# Patient Record
Sex: Female | Born: 1940 | Race: White | Hispanic: No | Smoking: Current some day smoker
Health system: Southern US, Community
[De-identification: ages and names within clinical notes are randomized; demographics above are authoritative.]

## PROBLEM LIST (undated history)

## (undated) DIAGNOSIS — K219 Gastro-esophageal reflux disease without esophagitis: Secondary | ICD-10-CM

## (undated) DIAGNOSIS — J309 Allergic rhinitis, unspecified: Secondary | ICD-10-CM

## (undated) DIAGNOSIS — G4700 Insomnia, unspecified: Secondary | ICD-10-CM

## (undated) DIAGNOSIS — M549 Dorsalgia, unspecified: Secondary | ICD-10-CM

## (undated) DIAGNOSIS — M542 Cervicalgia: Secondary | ICD-10-CM

## (undated) DIAGNOSIS — J209 Acute bronchitis, unspecified: Secondary | ICD-10-CM

## (undated) HISTORY — DX: Gastro-esophageal reflux disease without esophagitis: K21.9

## (undated) HISTORY — PX: VESICOVAGINAL FISTULA CLOSURE W/ TAH: SUR271

## (undated) HISTORY — DX: Cervicalgia: M54.2

## (undated) HISTORY — DX: Insomnia, unspecified: G47.00

## (undated) HISTORY — DX: Acute bronchitis, unspecified: J20.9

## (undated) HISTORY — PX: ABDOMINAL HYSTERECTOMY: SHX81

## (undated) HISTORY — PX: TUBAL LIGATION: SHX77

---

## 2000-08-14 ENCOUNTER — Emergency Department (HOSPITAL_COMMUNITY): Admission: EM | Admit: 2000-08-14 | Discharge: 2000-08-14 | Payer: Self-pay | Admitting: Emergency Medicine

## 2000-08-14 ENCOUNTER — Encounter: Payer: Self-pay | Admitting: Emergency Medicine

## 2001-11-29 ENCOUNTER — Encounter: Payer: Self-pay | Admitting: Family Medicine

## 2001-11-29 ENCOUNTER — Encounter: Admission: RE | Admit: 2001-11-29 | Discharge: 2001-11-29 | Payer: Self-pay | Admitting: Family Medicine

## 2003-01-03 ENCOUNTER — Encounter: Payer: Self-pay | Admitting: Family Medicine

## 2003-01-03 ENCOUNTER — Encounter: Admission: RE | Admit: 2003-01-03 | Discharge: 2003-01-03 | Payer: Self-pay | Admitting: Family Medicine

## 2004-12-24 ENCOUNTER — Ambulatory Visit (HOSPITAL_COMMUNITY): Admission: RE | Admit: 2004-12-24 | Discharge: 2004-12-24 | Payer: Self-pay | Admitting: Orthopedic Surgery

## 2005-02-03 ENCOUNTER — Ambulatory Visit (HOSPITAL_BASED_OUTPATIENT_CLINIC_OR_DEPARTMENT_OTHER): Admission: RE | Admit: 2005-02-03 | Discharge: 2005-02-03 | Payer: Self-pay | Admitting: Orthopedic Surgery

## 2005-02-03 ENCOUNTER — Ambulatory Visit (HOSPITAL_COMMUNITY): Admission: RE | Admit: 2005-02-03 | Discharge: 2005-02-03 | Payer: Self-pay | Admitting: Orthopedic Surgery

## 2005-03-10 ENCOUNTER — Ambulatory Visit: Payer: Self-pay | Admitting: *Deleted

## 2005-03-18 ENCOUNTER — Ambulatory Visit: Payer: Self-pay

## 2005-04-07 ENCOUNTER — Ambulatory Visit: Payer: Self-pay | Admitting: *Deleted

## 2005-04-10 ENCOUNTER — Encounter: Admission: RE | Admit: 2005-04-10 | Discharge: 2005-04-10 | Payer: Self-pay | Admitting: Family Medicine

## 2005-05-15 ENCOUNTER — Ambulatory Visit: Payer: Self-pay | Admitting: *Deleted

## 2006-07-21 ENCOUNTER — Ambulatory Visit (HOSPITAL_COMMUNITY): Admission: RE | Admit: 2006-07-21 | Discharge: 2006-07-21 | Payer: Self-pay | Admitting: Gastroenterology

## 2006-07-21 ENCOUNTER — Encounter (INDEPENDENT_AMBULATORY_CARE_PROVIDER_SITE_OTHER): Payer: Self-pay | Admitting: Specialist

## 2007-09-15 ENCOUNTER — Encounter: Admission: RE | Admit: 2007-09-15 | Discharge: 2007-09-15 | Payer: Self-pay | Admitting: Family Medicine

## 2009-04-16 ENCOUNTER — Encounter: Admission: RE | Admit: 2009-04-16 | Discharge: 2009-04-16 | Payer: Self-pay | Admitting: Family Medicine

## 2010-01-23 ENCOUNTER — Encounter: Admission: RE | Admit: 2010-01-23 | Discharge: 2010-01-23 | Payer: Self-pay | Admitting: Family Medicine

## 2011-04-03 NOTE — Op Note (Signed)
NAME:  Debra Dillon, Debra Dillon             ACCOUNT NO.:  [id number]   MEDICAL RECORD NO.:  [id number]          PATIENT TYPE:  AMB   LOCATION:  ENDO                         FACILITY:  [facility]   PHYSICIAN:  Anselmo Rod, M.D.  DATE OF BIRTH:  October 08, 1941   DATE OF PROCEDURE:  07/21/2006  DATE OF DISCHARGE:                                 OPERATIVE REPORT   PROCEDURE:  Screening colonoscopy.   ENDOSCOPIST:  Anselmo Rod, M.D.   INSTRUMENT USED:  Olympus video colonoscope.   INDICATIONS FOR PROCEDURE:  70 year old white female undergoing screening  colonoscopy to rule out colonic polyps, masses, etc.  The patient has had  black stools off and on that were found to be guaiac positive on a routine  physical.   PREPROCEDURE PREPARATION:  Informed consent was obtained from the patient.  The patient was fasted for four hours prior to the procedure and prepped  with 32 Osmo prep pills, 20 given the night prior to the procedure and 12  the morning of the procedure.  The risks and benefits of the procedure in  great detail.   PREPROCEDURE PHYSICAL:  Patient with stable vital signs.  Neck supple.  Chest clear to auscultation.  S1 and S2 regular.  Abdomen soft with normal  bowel sounds.   DESCRIPTION OF PROCEDURE:  The patient was placed in the left lateral  decubitus position, sedated with an additional 50 mcg of Fentanyl and 4 mg  Versed in slow incremental doses.  Once the patient was adequately sedated,  maintained on low flow oxygen and continuous cardiac monitoring, the Olympus  video colonoscope was advanced from the rectum to the cecum.  The  appendiceal orifice and ileocecal valve were clearly visualized and  photographed.  No masses, polyps, erosions, ulcerations, or diverticula were  seen.  Retroflexion in the rectum revealed no abnormalities.  The patient  tolerated the procedure well with no complications.  The terminal ileum  appeared healthy without lesions.   IMPRESSION:   1.Normal colonoscopy to the terminal ileum, no masses, polyps,  or diverticula seen.  2.No source of black stool could be identified.   RECOMMENDATIONS:  1.Continue high fiber diet with liberal fluid intake.  2.Avoid all non-steroidals for now.  3.Outpatient follow up in the next two weeks for repeat guaiac testing,  further recommendations will be made at that  time.  4.Repeat colonoscopy is recommended in the next ten years unless the patient  develops any abnormal symptoms in the interim.      Anselmo Rod, M.D.  Electronically Signed     JNM/MEDQ  D:  07/21/2006  T:  07/21/2006  Job:  [id number]   cc:   Ace Gins, MD

## 2011-04-03 NOTE — Op Note (Signed)
NAME:  Debra Dillon, EGLI             ACCOUNT NO.:  [id number]   MEDICAL RECORD NO.:  [id number]          PATIENT TYPE:  AMB   LOCATION:  ENDO                         FACILITY:  [facility]   PHYSICIAN:  Anselmo Rod, M.D.  DATE OF BIRTH:  1941/07/07   DATE OF PROCEDURE:  07/21/2006  DATE OF DISCHARGE:                                 OPERATIVE REPORT   PROCEDURE:  Esophagogastroduodenoscopy with multiple gastric biopsies.   ENDOSCOPIST:  Anselmo Rod, M.D.   INSTRUMENT USED:  Olympus video panendoscope.   INDICATIONS FOR PROCEDURE:  70 year old white female with a history of black  stools found to be guaiac positive on routine physical undergoing EGD to  rule out peptic ulcer disease, esophagitis, gastritis, etc.   PREPROCEDURE PREPARATION:  Informed consent was obtained from the patient.  The patient was fasted for four hours prior to the procedure.  The risks and  benefits of the procedure were discussed with the patient in great detail.   PREPROCEDURE PHYSICAL:  Patient with stable vital signs.  Neck supple.  Chest clear to auscultation.  S1 and S2 regular.  Abdomen soft with normal  bowel sounds.   DESCRIPTION OF PROCEDURE:  The patient was placed in the left lateral  decubitus position, sedated with 50 mcg of Fentanyl and 6 mg Versed in slow  incremental doses.  Once the patient was adequately sedated, maintained on  low flow oxygen and continuous cardiac monitoring, the Olympus video  panendoscope was advanced through the mouth piece over the tongue into the  esophagus under direct vision.  The entire esophagus appeared normal and was  widely patent with no evidence of ring, strictures, masses, esophagitis, or  Barrett's mucosa.  The scope was then advanced into the stomach.  Mild  diffuse gastritis was noted and gastric biopsy was done to rule out the  presence of H. pylori by pathology.  No ulcers, erosions, masses, or polyps  were identified.  The proximal small bowel  appeared normal.  There was no  obstruction.  The patient tolerated the procedure well without immediate  complications.   IMPRESSION:  1.Normal appearing esophagus.  2.Mild diffuse gastritis, biopsies done from the stomach to rule out H.  pylori by pathology.  3.Normal proximal small bowel.   RECOMMENDATIONS:  1.Await pathology results.  2.Treat with antibiotics if H. pylori present on biopsies.  3.Proceed with colonoscopy at this time, further recommendations to follow.      Anselmo Rod, M.D.  Electronically Signed     JNM/MEDQ  D:  07/21/2006  T:  07/21/2006  Job:  [id number]   cc:   Ace Gins, MD

## 2011-04-03 NOTE — Op Note (Signed)
NAME:  Debra Dillon, FLAMM             ACCOUNT NO.:  [id number]   MEDICAL RECORD NO.:  [id number]          PATIENT TYPE:  AMB   LOCATION:  DSC                          FACILITY:  [facility]   PHYSICIAN:  Katy Fitch. 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  Identification  of minimal degenerative changes of the glenohumeral joint and about a 60%  partial-thickness degenerative tear of the long head of the biceps at the  rotator interval and extensive anterior and superior synovitis of  glenohumeral joint.   OPERATION:  1.  Diagnostic arthroscopy of left glenohumeral joint.  2.  Arthroscopic debridement of long head of biceps, deep surface of      subscapularis and supraspinatus and extensive synovectomy  followed by  3.  Subacromial decompression with coracoacromial ligament release,      bursectomy and acromioplasty.  4.  Open resection of distal 15 mm of clavicle, i.e. Mumford procedure.   OPERATING SURGEON:  Katy Fitch. Sypher, M.D.   ASSISTANT:  Marveen Reeks. Dasnoit, P.A.C.   ANESTHESIA:  General endotracheal supplemented by interscalene block.   SUPERVISING  ANESTHESIOLOGIST:  Dr. Gypsy Balsam.   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  Recently she injured her left shoulder while carrying a heavy exercise  bicycle in and out of her vehicle while conducting her occupational exams.   She was seen by Dr. Dara Hoyer, her attending family physician, and noted  to have signs of impingement and a possible rotator cuff tear. She was  referred for an upper extremity orthopedic consult and was noted have signs  of stage II or III impingement, AC degenerative change and probable biceps  tendonopathy.  She was referred for an MRI of her shoulder which confirmed a  prominent AC joint, a medial acromion that was prominent, tendonopathy the  subscapularis and supraspinatus and biceps tendonopathy.   Due to her failure to respond to nonoperative measures, she is brought to  the operating at this time anticipating arthroscopic evaluation of the left  glenohumeral joint followed by probable subacromial decompression and  debridement of her long head of biceps, labrum and capsule as necessary and  possible rotator cuff repair if a significant partial or full-thickness tear  was identified.   Preoperatively, she was seen by Dr. Gypsy Balsam who  performed an interscalene  block. After informed consent, she is brought to the operating room at this  time.   PROCEDURE:  Lamara Brecht was brought to operating room and placed in  supine position on the table.  Following the induction of general  endotracheal anesthesia under direct supervision of Dr. Gypsy Balsam, she was  carefully positioned in the beach-chair position with the aid of a torso and  head holder designed for shoulder arthroscopy.   PAS active compression stockings were placed on her legs for thromboembolism  prophylaxis and care was taken to avoid any tight restrictions around her  abdomen or pelvis.  The entire  left upper extremity and forequarter were  prepped with DuraPrep and draped with impervious arthroscopy drapes.   The procedure commenced with distension of left shoulder with 20 cc of  sterile saline with a spinal needle brought in anteriorly.  The scope was  then placed with blunt technique through a standard posterior portal.  Diagnostic arthroscopy revealed abundant fragments of the biceps tendon  obscuring vision of the joint and evidence of frayed deep surface of the  supraspinatus at the anterior margin of the rotator interval and fraying of  the superior and lateral portion of the distal subscapularis.   An anterior portal was created under direct vision and a 4.5-mm suction  shaver was used to thoroughly debride the biceps, subscapularis, and to  perform synovectomy.  Hemostasis was achieved with bipolar cautery.  A  limited capsulectomy was performed, tenolysing the subscapularis tendon to  facilitate free range of motion limited by what appeared to be mild adhesive  capsulitis.  The most distal portion of the visible biceps was inaccessible  as it was deep to the anterior edge of the supraspinatus. I debrided the  biceps as thoroughly as possible followed by photographic documentation of  this anatomic predicament with a digital camera.   The scope was then removed from the glenohumeral joint and placed in the  subacromial space. A moderate degree of bursitis was noted. After bursectomy  was accomplished, the anatomy the coracoacromial arch was studied.  The  coracoacromial ligament was prominent and appeared to be causing bursitis  inflammation on the bursal side of the rotator cuff. The coracoacromial  ligament was released with the cutting cautery and removed with the suction  shaver. Hemostasis was achieved with the radiofrequency probe.  The anatomy  of the anterior acromion was carefully studied. There is clearly a prominence of the medial acromion at the [facility] joint that  was causing contact  with the rotator cuff with forward flexion and abduction.   The acromion was leveled to a type 1 morphology with a suction shaver  followed by hemostasis with a radiofrequency probe. The capsule of the [facility]  joint was taken down with the probe and hemostasis achieved.  The procedure  was then converted to an open resection of the distal clavicle. A 2 cm  incision was fashioned directly over the [facility] joint followed by subperiosteal  exposure of the distal 15 mm clavicle and elevation of the AC capsule.  An  oscillating saw was used to remove the distal 15 mm of clavicle followed by  careful hemostasis and repair of the anterior trapezius muscle to the  anterior third of deltoid closing the dead space aided by distal clavicle  resection.  A watertight seal was achieved.   The scope was then placed in subacromial space and the decompression and  distal clavicle resection documented photographically.  There were no apparent complications.  Ms. Kulakowski tolerated surgery and  anesthesia well.  We anticipate that she can be discharged home for  aftercare under the supervision of her family followed by immediate  initiation of outpatient physical therapy under the supervision of our  office therapist.   For aftercare, she is given prescriptions for Dilaudid 2 milligrams one or  two tablets p.o. q. 4 to 6 hours p.r.n. pain, 30 tablets without refill.  Also Motrin 600 milligrams one p.o. q.6 h. p.r.n. pain with food, 30 tablets  with one refill and Keflex 500 milligrams one p.o. q.8 h. x4 days as a  prophylactic antibiotic.      RVS/MEDQ  D:  02/03/2005  T:  02/03/2005  Job:  [id number]

## 2011-06-19 ENCOUNTER — Ambulatory Visit (INDEPENDENT_AMBULATORY_CARE_PROVIDER_SITE_OTHER): Payer: Medicare Other | Admitting: Critical Care Medicine

## 2011-06-19 ENCOUNTER — Ambulatory Visit (INDEPENDENT_AMBULATORY_CARE_PROVIDER_SITE_OTHER)
Admission: RE | Admit: 2011-06-19 | Discharge: 2011-06-19 | Disposition: A | Discharge status: Home / Self Care | Payer: Medicare Other | Source: Ambulatory Visit | Attending: Critical Care Medicine | Admitting: Critical Care Medicine

## 2011-06-19 ENCOUNTER — Encounter: Payer: Self-pay | Admitting: Critical Care Medicine

## 2011-06-19 DIAGNOSIS — J42 Unspecified chronic bronchitis: Secondary | ICD-10-CM

## 2011-06-19 DIAGNOSIS — R0600 Dyspnea, unspecified: Secondary | ICD-10-CM

## 2011-06-19 DIAGNOSIS — K219 Gastro-esophageal reflux disease without esophagitis: Secondary | ICD-10-CM

## 2011-06-19 DIAGNOSIS — R0609 Other forms of dyspnea: Secondary | ICD-10-CM

## 2011-06-19 DIAGNOSIS — J209 Acute bronchitis, unspecified: Secondary | ICD-10-CM

## 2011-06-19 MED ORDER — ALBUTEROL SULFATE HFA 108 (90 BASE) MCG/ACT IN AERS: 2.0000 | INHALATION_SPRAY | Freq: Four times a day (QID) | RESPIRATORY_TRACT | Status: DC | PRN

## 2011-06-19 NOTE — Progress Notes (Signed)
Subjective:    Patient ID: Debra Dillon, female    DOB: [birth year], 70 y.o.   MRN: [id number] 69 y.o. WF Referre d for eval of dyspnea HPI Comments: Pt had physical in June and PCP heard faint wheeze.    Shortness of Breath This is a new problem. The current episode started more than 1 month ago. Episode frequency: Had onset wheezing and severe dyspnea with exertion, car to hours, up and down stairs, carrying groceries. The problem has been gradually improving. Duration: Would recover 3-5 min. Associated symptoms include sputum production and wheezing. Pertinent negatives include no abdominal pain, chest pain, ear pain, fever, headaches, hemoptysis, leg swelling, neck pain, orthopnea, PND, rash, rhinorrhea, sore throat or vomiting. The symptoms are aggravated by any activity. Risk factors include smoking. She has tried beta agonist inhalers (Zpak rx  in 5/12.  ) for the symptoms. The treatment provided moderate relief. There is no history of allergies, asthma, CAD, COPD, a heart failure, PE or pneumonia.  Cough This is a new problem. The current episode started 1 to 4 weeks ago. The problem has been gradually improving. The problem occurs hourly (was hourly at its peak, now minimal). The cough is productive of sputum (mucus is yellow-tan). Associated symptoms include shortness of breath and wheezing. Pertinent negatives include no chest pain, chills, ear pain, fever, headaches, heartburn, hemoptysis, myalgias, postnasal drip, rash, rhinorrhea or sore throat. She has tried a beta-agonist inhaler for the symptoms. Her past medical history is significant for bronchitis. There is no history of asthma, bronchiectasis, COPD, emphysema, environmental allergies or pneumonia.     Past Medical History  Diagnosis Date  . GERD (gastroesophageal reflux disease)   . Insomnia   . Acute bronchitis   . Neck pain      Family History  Problem Relation Age of Onset  . Emphysema Sister   . COPD Sister   .  Allergies Daughter   . Clotting disorder Brother   . Arthritis Sister   . Heart disease Father   . Heart disease Brother   . Heart disease Sister   . Cancer Maternal Aunt      History   Social History  . Marital Status: Divorced    Spouse Name: N/A    Number of Children: 2  . Years of Education: N/A   Occupational History  . RN    Social History Main Topics  . Smoking status: Former Smoker -- 1.0 packs/day for 38 years    Types: Cigarettes    Quit date: 06/17/2011  . Smokeless tobacco: Never Used  . Alcohol Use: No  . Drug Use: No  . Sexually Active: Not on file   Other Topics Concern  . Not on file   Social History Narrative  . No narrative on file     No Known Allergies   Outpatient Prescriptions Prior to Visit  Medication Sig Dispense Refill  . alendronate (FOSAMAX) 70 MG tablet Take 70 mg by mouth every 7 (seven) days. Take with a full glass of water on an empty stomach.       Marland Kitchen aspirin 81 MG tablet Take 81 mg by mouth daily.        . Calcium Carbonate-Vitamin D 600-400 MG-UNIT per tablet Take 2 tablets by mouth daily.       . citalopram (CELEXA) 40 MG tablet Take 40 mg by mouth daily.        . naproxen sodium (ANAPROX) 550 MG tablet Take 550 mg by  mouth 2 (two) times daily.        . pantoprazole (PROTONIX) 40 MG tablet Take 40 mg by mouth daily.        . traZODone (DESYREL) 100 MG tablet Take 100 mg by mouth at bedtime.        . diphenhydrAMINE (SOMINEX) 25 MG tablet Take 25 mg by mouth daily.        . magnesium oxide (MAG-OX) 400 MG tablet Take 400 mg by mouth daily.            Review of Systems  Constitutional: Positive for activity change, appetite change and fatigue. Negative for fever, chills, diaphoresis and unexpected weight change.  HENT: Positive for sneezing. Negative for hearing loss, ear pain, nosebleeds, congestion, sore throat, facial swelling, rhinorrhea, mouth sores, trouble swallowing, neck pain, neck stiffness, dental problem, voice  change, postnasal drip, sinus pressure, tinnitus and ear discharge.   Eyes: Negative for photophobia, discharge, itching and visual disturbance.  Respiratory: Positive for cough, sputum production, shortness of breath and wheezing. Negative for apnea, hemoptysis, choking, chest tightness and stridor.   Cardiovascular: Positive for palpitations. Negative for chest pain, orthopnea, leg swelling and PND.  Gastrointestinal: Negative for heartburn, nausea, vomiting, abdominal pain, constipation, blood in stool and abdominal distention.  Genitourinary: Negative for dysuria, urgency, frequency, hematuria, flank pain, decreased urine volume and difficulty urinating.  Musculoskeletal: Positive for back pain. Negative for myalgias, joint swelling, arthralgias and gait problem.  Skin: Negative for color change, pallor and rash.  Neurological: Negative for dizziness, tremors, seizures, syncope, speech difficulty, weakness, light-headedness, numbness and headaches.  Hematological: Negative for environmental allergies and adenopathy. Does not bruise/bleed easily.  Psychiatric/Behavioral: Positive for agitation. Negative for confusion and sleep disturbance. The patient is not nervous/anxious.        Objective:   Physical Exam Filed Vitals:   06/19/11 1051  BP: 122/80  Pulse: 78  Temp: 98.6 F (37 C)  TempSrc: Oral  Height: 4\' 11"  (1.499 m)  Weight: 108 lb 12.8 oz (49.351 kg)  SpO2: 96%    Gen: Pleasant, well-nourished, in no distress,  normal affect  ENT: No lesions,  mouth clear,  oropharynx clear, no postnasal drip  Neck: No JVD, no TMG, no carotid bruits  Lungs: No use of accessory muscles, no dullness to percussion, clear, no wheeze or rhonchi  Cardiovascular: RRR, heart sounds normal, no murmur or gallops, no peripheral edema  Abdomen: soft and NT, no HSM,  BS normal  Musculoskeletal: No deformities, no cyanosis or clubbing  Neuro: alert, non focal  Skin: Warm, no lesions or  rashes        Assessment & Plan:   Acute bronchitis Improved Normal spirometry No need for inhaled meds Plan Cont prn SABA Focus on smoking cessation    Updated Medication List Outpatient Encounter Prescriptions as of 06/19/2011  Medication Sig Dispense Refill  . albuterol (PROAIR HFA) 108 (90 BASE) MCG/ACT inhaler Inhale 2 puffs into the lungs every 6 (six) hours as needed.      Marland Kitchen alendronate (FOSAMAX) 70 MG tablet Take 70 mg by mouth every 7 (seven) days. Take with a full glass of water on an empty stomach.       Marland Kitchen aspirin 81 MG tablet Take 81 mg by mouth daily.        . Calcium Carbonate-Vitamin D 600-400 MG-UNIT per tablet Take 2 tablets by mouth daily.       . citalopram (CELEXA) 40 MG tablet Take 40 mg by mouth  daily.        . naproxen sodium (ANAPROX) 550 MG tablet Take 550 mg by mouth 2 (two) times daily.        Marland Kitchen OVER THE COUNTER MEDICATION Mega Red - 1 capsule once daily       . pantoprazole (PROTONIX) 40 MG tablet Take 40 mg by mouth daily.        . traZODone (DESYREL) 100 MG tablet Take 100 mg by mouth at bedtime.        . Zinc-Magnesium Aspart-Vit B6 (ZINC MAGNESIUM ASPARTATE PO) Take 3 tablets by mouth at bedtime.        Marland Kitchen DISCONTD: PROAIR HFA 108 (90 BASE) MCG/ACT inhaler as needed.      Marland Kitchen DISCONTD: diphenhydrAMINE (SOMINEX) 25 MG tablet Take 25 mg by mouth daily.        Marland Kitchen DISCONTD: magnesium oxide (MAG-OX) 400 MG tablet Take 400 mg by mouth daily.

## 2011-06-19 NOTE — Patient Instructions (Signed)
Use proair as needed only  No other medications needed Stay off cigarettes, if you relapse , consider the Nicoderm CQ system , start with 21mg  system Chest xray today, we will call with results Return as needed or if symptoms return

## 2011-06-20 NOTE — Assessment & Plan Note (Signed)
Improved Normal spirometry No need for inhaled meds Plan Cont prn SABA Focus on smoking cessation

## 2011-06-24 NOTE — Progress Notes (Signed)
Quick Note:  Called, spoke with pt. She is aware CXR was neg for cancer or any acute process per PW. She verbalized understanding of this and voiced no further questions/concerns at this time. ______

## 2012-11-23 ENCOUNTER — Other Ambulatory Visit: Payer: Self-pay | Admitting: Family Medicine

## 2012-11-23 DIAGNOSIS — M810 Age-related osteoporosis without current pathological fracture: Secondary | ICD-10-CM

## 2012-11-23 DIAGNOSIS — Z1231 Encounter for screening mammogram for malignant neoplasm of breast: Secondary | ICD-10-CM

## 2012-12-14 ENCOUNTER — Other Ambulatory Visit: Payer: Medicare Other

## 2012-12-14 ENCOUNTER — Ambulatory Visit: Payer: Medicare Other

## 2013-01-12 ENCOUNTER — Ambulatory Visit
Admission: RE | Admit: 2013-01-12 | Discharge: 2013-01-12 | Disposition: A | Discharge status: Home / Self Care | Payer: Medicare Other | Source: Ambulatory Visit | Attending: Family Medicine | Admitting: Family Medicine

## 2013-01-12 DIAGNOSIS — Z1231 Encounter for screening mammogram for malignant neoplasm of breast: Secondary | ICD-10-CM

## 2013-03-29 ENCOUNTER — Emergency Department (HOSPITAL_COMMUNITY)
Admission: EM | Admit: 2013-03-29 | Discharge: 2013-03-29 | Disposition: A | Discharge status: Home / Self Care | Payer: Medicare Other | Attending: Emergency Medicine | Admitting: Emergency Medicine

## 2013-03-29 ENCOUNTER — Emergency Department (HOSPITAL_COMMUNITY): Payer: Medicare Other

## 2013-03-29 ENCOUNTER — Encounter (HOSPITAL_COMMUNITY): Payer: Self-pay | Admitting: *Deleted

## 2013-03-29 DIAGNOSIS — Z87891 Personal history of nicotine dependence: Secondary | ICD-10-CM | POA: Insufficient documentation

## 2013-03-29 DIAGNOSIS — Y9389 Activity, other specified: Secondary | ICD-10-CM | POA: Insufficient documentation

## 2013-03-29 DIAGNOSIS — S0191XA Laceration without foreign body of unspecified part of head, initial encounter: Secondary | ICD-10-CM

## 2013-03-29 DIAGNOSIS — G4700 Insomnia, unspecified: Secondary | ICD-10-CM | POA: Insufficient documentation

## 2013-03-29 DIAGNOSIS — W1809XA Striking against other object with subsequent fall, initial encounter: Secondary | ICD-10-CM | POA: Insufficient documentation

## 2013-03-29 DIAGNOSIS — F0390 Unspecified dementia without behavioral disturbance: Secondary | ICD-10-CM | POA: Insufficient documentation

## 2013-03-29 DIAGNOSIS — K219 Gastro-esophageal reflux disease without esophagitis: Secondary | ICD-10-CM | POA: Insufficient documentation

## 2013-03-29 DIAGNOSIS — Z79899 Other long term (current) drug therapy: Secondary | ICD-10-CM | POA: Insufficient documentation

## 2013-03-29 DIAGNOSIS — Z23 Encounter for immunization: Secondary | ICD-10-CM | POA: Insufficient documentation

## 2013-03-29 DIAGNOSIS — Y92009 Unspecified place in unspecified non-institutional (private) residence as the place of occurrence of the external cause: Secondary | ICD-10-CM | POA: Insufficient documentation

## 2013-03-29 DIAGNOSIS — R4182 Altered mental status, unspecified: Secondary | ICD-10-CM | POA: Insufficient documentation

## 2013-03-29 DIAGNOSIS — W19XXXA Unspecified fall, initial encounter: Secondary | ICD-10-CM

## 2013-03-29 DIAGNOSIS — Z7982 Long term (current) use of aspirin: Secondary | ICD-10-CM | POA: Insufficient documentation

## 2013-03-29 DIAGNOSIS — Z8739 Personal history of other diseases of the musculoskeletal system and connective tissue: Secondary | ICD-10-CM | POA: Insufficient documentation

## 2013-03-29 DIAGNOSIS — S0100XA Unspecified open wound of scalp, initial encounter: Secondary | ICD-10-CM | POA: Insufficient documentation

## 2013-03-29 DIAGNOSIS — Z8709 Personal history of other diseases of the respiratory system: Secondary | ICD-10-CM | POA: Insufficient documentation

## 2013-03-29 MED ORDER — TETANUS-DIPHTH-ACELL PERTUSSIS 5-2.5-18.5 LF-MCG/0.5 IM SUSP
0.5000 mL | Freq: Once | INTRAMUSCULAR | Status: AC
  Administered 2013-03-29: 0.5 mL via INTRAMUSCULAR
  Filled 2013-03-29: qty 0.5

## 2013-03-29 NOTE — ED Notes (Signed)
Pt states was standing on step stool to put something back in the top of her closet when she lost balance and fell backwards hitting dresser or chair, denies LOC, laceration to back of head, bleeding controlled at this time. Pt a/o x 4.

## 2013-03-29 NOTE — ED Provider Notes (Signed)
History     CSN: [id number]  Arrival date & time 03/29/13  1446   First MD Initiated Contact with Patient 03/29/13 1507      Chief Complaint  Patient presents with  . Head Injury  . Fall    (Consider location/radiation/quality/duration/timing/severity/associated sxs/prior treatment) HPI  The patient presents immediately after a fall. She was on a stool, reaching for an object from a shelf, fell backwards.  She struck her head, but denies loss of consciousness, or any pain following the event. She also denies any new unilateral weakness, confusion, disorientation, visual changes, neck pain, chest pain. Since the event she has had no new complaints. On exam she appears comfortable, again denies complaints.   Past Medical History  Diagnosis Date  . GERD (gastroesophageal reflux disease)   . Insomnia   . Acute bronchitis   . Neck pain     Past Surgical History  Procedure Laterality Date  . Tubal ligation    . Vesicovaginal fistula closure w/ tah      Family History  Problem Relation Age of Onset  . Emphysema Sister   . COPD Sister   . Allergies Daughter   . Clotting disorder Brother   . Arthritis Sister   . Heart disease Father   . Heart disease Brother   . Heart disease Sister   . Cancer Maternal Aunt     History  Substance Use Topics  . Smoking status: Former Smoker -- 1.00 packs/day for 38 years    Types: Cigarettes    Quit date: 06/17/2011  . Smokeless tobacco: Never Used  . Alcohol Use: No    OB History   Grav Para Term Preterm Abortions TAB SAB Ect Mult Living                  Review of Systems  All other systems reviewed and are negative.    Allergies  Review of patient's allergies indicates no known allergies.  Home Medications   Current Outpatient Rx  Name  Route  Sig  Dispense  Refill  . alendronate (FOSAMAX) 70 MG tablet   Oral   Take 70 mg by mouth every 7 (seven) days. Take with a full glass of water on an empty stomach.    sunday         . aspirin 81 MG tablet   Oral   Take 81 mg by mouth daily.           . citalopram (CELEXA) 40 MG tablet   Oral   Take 40 mg by mouth daily.           . diphenhydrAMINE (BENADRYL) 25 MG tablet   Oral   Take 25 mg by mouth every 6 (six) hours as needed for itching (allergies).         . methocarbamol (ROBAXIN) 750 MG tablet   Oral   Take 750 mg by mouth 2 (two) times daily as needed (spasms).         . naproxen sodium (ANAPROX) 550 MG tablet   Oral   Take 550 mg by mouth 2 (two) times daily.           Marland Kitchen OVER THE COUNTER MEDICATION      Mega Red - 1 capsule once daily          . pantoprazole (PROTONIX) 40 MG tablet   Oral   Take 40 mg by mouth daily.           Marland Kitchen  traZODone (DESYREL) 100 MG tablet   Oral   Take 100 mg by mouth at bedtime.             BP 159/75  Pulse 104  Temp(Src) 99 F (37.2 C) (Oral)  Resp 18  Ht 4\' 10"  (1.473 m)  Wt 93 lb 3 oz (42.27 kg)  BMI 19.48 kg/m2  SpO2 98%  Physical Exam  Nursing note and vitals reviewed. Constitutional: She is oriented to person, place, and time. She appears well-developed and well-nourished. No distress.  HENT:  Head: Normocephalic and atraumatic.    Nose: Nose normal.  Mouth/Throat: Oropharynx is clear and moist.  Eyes: Conjunctivae and EOM are normal. Pupils are equal, round, and reactive to light.  Neck: Full passive range of motion without pain. No spinous process tenderness present. No rigidity. No erythema and normal range of motion present.  Cardiovascular: Normal rate and regular rhythm.   Pulmonary/Chest: Effort normal and breath sounds normal. No stridor. No respiratory distress.  Abdominal: She exhibits no distension.  Musculoskeletal: She exhibits no edema.  Neurological: She is alert and oriented to person, place, and time. No cranial nerve deficit. She exhibits normal muscle tone. Coordination normal.  Skin: Skin is warm and dry.  Psychiatric: She has a normal mood  and affect.    ED Course  Procedures (including critical care time)  Labs Reviewed - No data to display Ct Head Wo Contrast  03/29/2013   *RADIOLOGY REPORT*  Clinical Data: Altered mental status.  Fall.  Laceration to posterior head.  CT HEAD WITHOUT CONTRAST  Technique:  Contiguous axial images were obtained from the base of the skull through the vertex without contrast.  Comparison: None.  Findings: Atrophy and moderate white matter disease are present. This is somewhat advanced for age.  No acute cortical infarct, hemorrhage, or mass lesion is present. The ventricles are proportionate to the degree of atrophy.  No significant extra-axial fluid collection is present.  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  The paranasal sinuses and mastoid air cells are clear.  The osseous skull is intact.  Atherosclerotic calcifications are present in the cavernous carotid arteries bilaterally.  IMPRESSION:  1.  Atrophy and white matter disease is somewhat advanced for age. This likely reflects the sequelae of chronic microvascular ischemia. 2.  No acute intracranial abnormality. 3.  Atherosclerosis.   Original Report Authenticated By: Marin Roberts, M.D.     No diagnosis found.  LACERATION REPAIR Performed by: Gerhard Munch Authorized by: Gerhard Munch Consent: Verbal consent obtained. Risks and benefits: risks, benefits and alternatives were discussed Consent given by: patient Patient identity confirmed: provided demographic data Prepped and Draped in normal sterile fashion Wound explored  Laceration Location: scalp  Laceration Length: 3cm  No Foreign Bodies seen or palpated  Anesthesia: None  Irrigation method: syringe Amount of cleaning: standard  Skin closure: staple  Number of staples: 2  Technique: close  Patient tolerance: Patient tolerated the procedure well with no immediate complications.   O2- 97%ra, normal    MDM  The patient presents  immediately after a fall with a small head laceration. On exam she is awake and alert, but with her history of dementia, the significance of trauma, head CT was indicated.  This was largely unremarkable aside from microvascular changes. I counseled her on the need to stop smoking. The patient has no neck pain, no limited range of motion, and does not meet next is criteria for imaging. She was d/c in stable  condition.        Gerhard Munch, MD 03/29/13 [phone]

## 2014-03-14 ENCOUNTER — Other Ambulatory Visit: Payer: Self-pay

## 2014-05-12 ENCOUNTER — Emergency Department (HOSPITAL_COMMUNITY): Payer: Medicare Other

## 2014-05-12 ENCOUNTER — Encounter (HOSPITAL_COMMUNITY): Payer: Self-pay | Admitting: Emergency Medicine

## 2014-05-12 ENCOUNTER — Inpatient Hospital Stay (HOSPITAL_COMMUNITY)
Admission: EM | Admit: 2014-05-12 | Discharge: 2014-05-15 | DRG: 391 | Disposition: A | Discharge status: Skilled Nursing Facility | Payer: Medicare Other | Attending: Family Medicine | Admitting: Family Medicine

## 2014-05-12 DIAGNOSIS — R197 Diarrhea, unspecified: Secondary | ICD-10-CM | POA: Diagnosis present

## 2014-05-12 DIAGNOSIS — E871 Hypo-osmolality and hyponatremia: Secondary | ICD-10-CM

## 2014-05-12 DIAGNOSIS — R634 Abnormal weight loss: Secondary | ICD-10-CM

## 2014-05-12 DIAGNOSIS — K219 Gastro-esophageal reflux disease without esophagitis: Secondary | ICD-10-CM | POA: Diagnosis present

## 2014-05-12 DIAGNOSIS — Z79899 Other long term (current) drug therapy: Secondary | ICD-10-CM

## 2014-05-12 DIAGNOSIS — E876 Hypokalemia: Secondary | ICD-10-CM | POA: Diagnosis present

## 2014-05-12 DIAGNOSIS — IMO0002 Reserved for concepts with insufficient information to code with codable children: Secondary | ICD-10-CM

## 2014-05-12 DIAGNOSIS — Z66 Do not resuscitate: Secondary | ICD-10-CM | POA: Diagnosis present

## 2014-05-12 DIAGNOSIS — Z791 Long term (current) use of non-steroidal anti-inflammatories (NSAID): Secondary | ICD-10-CM

## 2014-05-12 DIAGNOSIS — R112 Nausea with vomiting, unspecified: Secondary | ICD-10-CM

## 2014-05-12 DIAGNOSIS — N179 Acute kidney failure, unspecified: Secondary | ICD-10-CM

## 2014-05-12 DIAGNOSIS — R748 Abnormal levels of other serum enzymes: Secondary | ICD-10-CM | POA: Diagnosis present

## 2014-05-12 DIAGNOSIS — M810 Age-related osteoporosis without current pathological fracture: Secondary | ICD-10-CM | POA: Diagnosis present

## 2014-05-12 DIAGNOSIS — K529 Noninfective gastroenteritis and colitis, unspecified: Principal | ICD-10-CM | POA: Diagnosis present

## 2014-05-12 DIAGNOSIS — F17200 Nicotine dependence, unspecified, uncomplicated: Secondary | ICD-10-CM | POA: Diagnosis present

## 2014-05-12 DIAGNOSIS — G8929 Other chronic pain: Secondary | ICD-10-CM | POA: Diagnosis present

## 2014-05-12 DIAGNOSIS — R5383 Other fatigue: Secondary | ICD-10-CM

## 2014-05-12 DIAGNOSIS — Z7982 Long term (current) use of aspirin: Secondary | ICD-10-CM

## 2014-05-12 DIAGNOSIS — R5381 Other malaise: Secondary | ICD-10-CM

## 2014-05-12 DIAGNOSIS — K5289 Other specified noninfective gastroenteritis and colitis: Secondary | ICD-10-CM

## 2014-05-12 DIAGNOSIS — B370 Candidal stomatitis: Secondary | ICD-10-CM

## 2014-05-12 DIAGNOSIS — D72829 Elevated white blood cell count, unspecified: Secondary | ICD-10-CM

## 2014-05-12 DIAGNOSIS — R7309 Other abnormal glucose: Secondary | ICD-10-CM | POA: Diagnosis present

## 2014-05-12 DIAGNOSIS — G4700 Insomnia, unspecified: Secondary | ICD-10-CM | POA: Diagnosis present

## 2014-05-12 DIAGNOSIS — E43 Unspecified severe protein-calorie malnutrition: Secondary | ICD-10-CM

## 2014-05-12 DIAGNOSIS — R531 Weakness: Secondary | ICD-10-CM

## 2014-05-12 DIAGNOSIS — Z8249 Family history of ischemic heart disease and other diseases of the circulatory system: Secondary | ICD-10-CM

## 2014-05-12 HISTORY — DX: Dorsalgia, unspecified: M54.9

## 2014-05-12 HISTORY — DX: Allergic rhinitis, unspecified: J30.9

## 2014-05-12 LAB — COMPREHENSIVE METABOLIC PANEL
ALK PHOS: 108 U/L (ref 39–117)
ALT: 9 U/L (ref 0–35)
AST: 10 U/L (ref 0–37)
Albumin: 2.9 g/dL — ABNORMAL LOW (ref 3.5–5.2)
BUN: 36 mg/dL — AB (ref 6–23)
CALCIUM: 8.7 mg/dL (ref 8.4–10.5)
CO2: 24 meq/L (ref 19–32)
CREATININE: 1.38 mg/dL — AB (ref 0.50–1.10)
Chloride: 92 mEq/L — ABNORMAL LOW (ref 96–112)
GFR, EST AFRICAN AMERICAN: 43 mL/min — AB (ref >90)
GFR, EST NON AFRICAN AMERICAN: 37 mL/min — AB (ref >90)
GLUCOSE: 138 mg/dL — AB (ref 70–99)
Potassium: 3.4 mEq/L — ABNORMAL LOW (ref 3.7–5.3)
Sodium: 132 mEq/L — ABNORMAL LOW (ref 137–147)
Total Bilirubin: 0.2 mg/dL — ABNORMAL LOW (ref 0.3–1.2)
Total Protein: 7.1 g/dL (ref 6.0–8.3)

## 2014-05-12 LAB — CBC WITH DIFFERENTIAL/PLATELET
BASOS PCT: 0 % (ref 0–1)
Basophils Absolute: 0.1 10*3/uL (ref 0.0–0.1)
EOS ABS: 0 10*3/uL (ref 0.0–0.7)
Eosinophils Relative: 0 % (ref 0–5)
HEMATOCRIT: 39.6 % (ref 36.0–46.0)
Hemoglobin: 12.5 g/dL (ref 12.0–15.0)
LYMPHS ABS: 1.3 10*3/uL (ref 0.7–4.0)
Lymphocytes Relative: 11 % — ABNORMAL LOW (ref 12–46)
MCH: 22.3 pg — AB (ref 26.0–34.0)
MCHC: 31.6 g/dL (ref 30.0–36.0)
MCV: 70.7 fL — AB (ref 78.0–100.0)
MONO ABS: 1.1 10*3/uL — AB (ref 0.1–1.0)
Monocytes Relative: 9 % (ref 3–12)
NEUTROS PCT: 80 % — AB (ref 43–77)
Neutro Abs: 10.1 10*3/uL — ABNORMAL HIGH (ref 1.7–7.7)
Platelets: 394 10*3/uL (ref 150–400)
RBC: 5.6 MIL/uL — AB (ref 3.87–5.11)
RDW: 22.6 % — AB (ref 11.5–15.5)
WBC: 12.7 10*3/uL — ABNORMAL HIGH (ref 4.0–10.5)

## 2014-05-12 LAB — LIPASE, BLOOD: LIPASE: 82 U/L — AB (ref 11–59)

## 2014-05-12 LAB — URINALYSIS, ROUTINE W REFLEX MICROSCOPIC
Glucose, UA: NEGATIVE mg/dL
HGB URINE DIPSTICK: NEGATIVE
Ketones, ur: NEGATIVE mg/dL
Leukocytes, UA: NEGATIVE
Nitrite: NEGATIVE
PROTEIN: 30 mg/dL — AB
Specific Gravity, Urine: 1.026 (ref 1.005–1.030)
UROBILINOGEN UA: 0.2 mg/dL (ref 0.0–1.0)
pH: 5.5 (ref 5.0–8.0)

## 2014-05-12 LAB — OCCULT BLOOD X 1 CARD TO LAB, STOOL: FECAL OCCULT BLD: POSITIVE — AB

## 2014-05-12 LAB — URINE MICROSCOPIC-ADD ON

## 2014-05-12 MED ORDER — ONDANSETRON HCL 4 MG/2ML IJ SOLN: 4.0000 mg | Freq: Four times a day (QID) | INTRAMUSCULAR | Status: DC | PRN

## 2014-05-12 MED ORDER — ENOXAPARIN SODIUM 30 MG/0.3ML ~~LOC~~ SOLN
30.0000 mg | SUBCUTANEOUS | Status: DC
  Administered 2014-05-12 – 2014-05-14 (×3): 30 mg via SUBCUTANEOUS
  Filled 2014-05-12 (×4): qty 0.3

## 2014-05-12 MED ORDER — TRAZODONE HCL 50 MG PO TABS
100.0000 mg | ORAL_TABLET | Freq: Every evening | ORAL | Status: DC | PRN
  Administered 2014-05-13 – 2014-05-14 (×2): 100 mg via ORAL
  Filled 2014-05-12 (×2): qty 2

## 2014-05-12 MED ORDER — KCL IN DEXTROSE-NACL 20-5-0.45 MEQ/L-%-% IV SOLN
INTRAVENOUS | Status: DC
  Administered 2014-05-12 – 2014-05-14 (×3): via INTRAVENOUS
  Administered 2014-05-15: 75 mL via INTRAVENOUS
  Filled 2014-05-12 (×6): qty 1000

## 2014-05-12 MED ORDER — SODIUM CHLORIDE 0.9 % IV SOLN
1000.0000 mL | INTRAVENOUS | Status: DC
Start: 2014-05-12 — End: 2014-05-12
  Administered 2014-05-12: 1000 mL via INTRAVENOUS

## 2014-05-12 MED ORDER — FLUCONAZOLE 100 MG PO TABS
100.0000 mg | ORAL_TABLET | Freq: Every day | ORAL | Status: DC
  Administered 2014-05-12 – 2014-05-15 (×4): 100 mg via ORAL
  Filled 2014-05-12 (×4): qty 1

## 2014-05-12 MED ORDER — IOHEXOL 300 MG/ML  SOLN
50.0000 mL | Freq: Once | INTRAMUSCULAR | Status: AC | PRN
  Administered 2014-05-12: 50 mL via ORAL

## 2014-05-12 MED ORDER — ACETAMINOPHEN 650 MG RE SUPP: 650.0000 mg | Freq: Four times a day (QID) | RECTAL | Status: DC | PRN

## 2014-05-12 MED ORDER — ESCITALOPRAM OXALATE 20 MG PO TABS
20.0000 mg | ORAL_TABLET | Freq: Every day | ORAL | Status: DC
  Administered 2014-05-13 – 2014-05-15 (×3): 20 mg via ORAL
  Filled 2014-05-12 (×3): qty 1

## 2014-05-12 MED ORDER — ONDANSETRON HCL 4 MG/2ML IJ SOLN
4.0000 mg | Freq: Once | INTRAMUSCULAR | Status: AC | PRN
  Administered 2014-05-12: 4 mg via INTRAVENOUS
  Filled 2014-05-12: qty 2

## 2014-05-12 MED ORDER — FLUTICASONE PROPIONATE 50 MCG/ACT NA SUSP
1.0000 | Freq: Every day | NASAL | Status: DC
  Administered 2014-05-13 – 2014-05-15 (×3): 1 via NASAL
  Filled 2014-05-12: qty 16

## 2014-05-12 MED ORDER — METRONIDAZOLE IN NACL 5-0.79 MG/ML-% IV SOLN
500.0000 mg | Freq: Once | INTRAVENOUS | Status: AC
  Administered 2014-05-12: 500 mg via INTRAVENOUS
  Filled 2014-05-12: qty 100

## 2014-05-12 MED ORDER — SODIUM CHLORIDE 0.9 % IV SOLN: INTRAVENOUS | Status: AC

## 2014-05-12 MED ORDER — MAGIC MOUTHWASH W/LIDOCAINE
10.0000 mL | Freq: Four times a day (QID) | ORAL | Status: DC | PRN
  Filled 2014-05-12: qty 10

## 2014-05-12 MED ORDER — METRONIDAZOLE IN NACL 5-0.79 MG/ML-% IV SOLN
500.0000 mg | Freq: Three times a day (TID) | INTRAVENOUS | Status: DC
  Administered 2014-05-13 – 2014-05-15 (×8): 500 mg via INTRAVENOUS
  Filled 2014-05-12 (×10): qty 100

## 2014-05-12 MED ORDER — CIPROFLOXACIN IN D5W 400 MG/200ML IV SOLN
400.0000 mg | Freq: Once | INTRAVENOUS | Status: DC
  Filled 2014-05-12: qty 200

## 2014-05-12 MED ORDER — BIOTENE DRY MOUTH MT LIQD
15.0000 mL | Freq: Two times a day (BID) | OROMUCOSAL | Status: DC
  Administered 2014-05-13 – 2014-05-15 (×3): 15 mL via OROMUCOSAL

## 2014-05-12 MED ORDER — PANTOPRAZOLE SODIUM 40 MG PO TBEC
80.0000 mg | DELAYED_RELEASE_TABLET | Freq: Every day | ORAL | Status: DC
  Administered 2014-05-13 – 2014-05-15 (×3): 80 mg via ORAL
  Filled 2014-05-12 (×3): qty 2

## 2014-05-12 MED ORDER — SODIUM CHLORIDE 0.9 % IV SOLN
1000.0000 mL | Freq: Once | INTRAVENOUS | Status: AC
  Administered 2014-05-12: 1000 mL via INTRAVENOUS

## 2014-05-12 MED ORDER — CHLORHEXIDINE GLUCONATE 0.12 % MT SOLN
15.0000 mL | Freq: Two times a day (BID) | OROMUCOSAL | Status: DC
  Administered 2014-05-13 – 2014-05-15 (×4): 15 mL via OROMUCOSAL
  Filled 2014-05-12 (×7): qty 15

## 2014-05-12 MED ORDER — ACETAMINOPHEN 325 MG PO TABS: 650.0000 mg | ORAL_TABLET | Freq: Four times a day (QID) | ORAL | Status: DC | PRN

## 2014-05-12 MED ORDER — MORPHINE SULFATE 2 MG/ML IJ SOLN
1.0000 mg | INTRAMUSCULAR | Status: DC | PRN
Start: 2014-05-12 — End: 2014-05-15

## 2014-05-12 MED ORDER — TRAMADOL HCL 50 MG PO TABS
50.0000 mg | ORAL_TABLET | Freq: Four times a day (QID) | ORAL | Status: DC | PRN
  Administered 2014-05-13 – 2014-05-15 (×7): 50 mg via ORAL
  Filled 2014-05-12 (×7): qty 1

## 2014-05-12 MED ORDER — ADULT MULTIVITAMIN W/MINERALS CH
1.0000 | ORAL_TABLET | Freq: Every day | ORAL | Status: DC
  Administered 2014-05-12 – 2014-05-15 (×4): 1 via ORAL
  Filled 2014-05-12 (×4): qty 1

## 2014-05-12 MED ORDER — ASPIRIN EC 81 MG PO TBEC
81.0000 mg | DELAYED_RELEASE_TABLET | Freq: Every day | ORAL | Status: DC
  Administered 2014-05-13 – 2014-05-15 (×3): 81 mg via ORAL
  Filled 2014-05-12 (×4): qty 1

## 2014-05-12 MED ORDER — BOOST / RESOURCE BREEZE PO LIQD
1.0000 | Freq: Three times a day (TID) | ORAL | Status: DC
  Administered 2014-05-12 – 2014-05-15 (×6): 1 via ORAL

## 2014-05-12 MED ORDER — ONDANSETRON HCL 4 MG PO TABS
4.0000 mg | ORAL_TABLET | Freq: Four times a day (QID) | ORAL | Status: DC | PRN
  Administered 2014-05-13 (×2): 4 mg via ORAL
  Filled 2014-05-12 (×2): qty 1

## 2014-05-12 MED ORDER — CIPROFLOXACIN IN D5W 400 MG/200ML IV SOLN
400.0000 mg | INTRAVENOUS | Status: DC
  Filled 2014-05-12: qty 200

## 2014-05-12 NOTE — ED Provider Notes (Signed)
CSN: [id number]     Arrival date & time 05/12/14  1204 History   First MD Initiated Contact with Patient 05/12/14 1348     Chief Complaint  Patient presents with  . Weakness  . Emesis  . Diarrhea    Patient is a 73 y.o. female presenting with vomiting and diarrhea. The history is provided by the patient.  Emesis Associated symptoms: diarrhea   Diarrhea Associated symptoms: vomiting   Pt has been having trouble over the last several weeks.  This has been ongoing for 30 days.  She has had vomiting and diarrhea associated with weight loss (19 lbs).  Pt was vomiting 2-4 times daily.  She has been dry heaving today but no emesis.   She has not had any diarrhea for a couple of days but did have a couple of episodes yesterday.  No blood in her stool.  No recent antibiotics.  NO foreign travel.  She has been having pain in her abdomen.  It has been all over her abdomen.  No specific location.  She does have back pain troubles as well but this was prior to the abdominal pain problems.  She saw her doctor and is scheduled to have a CT scan on Monday.  Today she got much worse and has felt weaker as if she might fall.  She slept all day yesterday. Past Medical History  Diagnosis Date  . GERD (gastroesophageal reflux disease)   . Insomnia   . Acute bronchitis   . Neck pain   . Back pain    Past Surgical History  Procedure Laterality Date  . Tubal ligation    . Vesicovaginal fistula closure w/ tah     Family History  Problem Relation Age of Onset  . Emphysema Sister   . COPD Sister   . Allergies Daughter   . Clotting disorder Brother   . Arthritis Sister   . Heart disease Father   . Heart disease Brother   . Heart disease Sister   . Cancer Maternal Aunt    History  Substance Use Topics  . Smoking status: Current Some Day Smoker -- 1.00 packs/day for 38 years    Types: Cigarettes    Last Attempt to Quit: 06/17/2011  . Smokeless tobacco: Never Used  . Alcohol Use: No   OB History    Grav Para Term Preterm Abortions TAB SAB Ect Mult Living                 Review of Systems  Gastrointestinal: Positive for vomiting and diarrhea.  All other systems reviewed and are negative.     Allergies  Review of patient's allergies indicates no known allergies.  Home Medications   Prior to Admission medications   Medication Sig Start Date End Date Taking? Authorizing Joss Friedel  alendronate (FOSAMAX) 70 MG tablet Take 70 mg by mouth every 7 (seven) days. Take with a full glass of water on an empty stomach.   sunday   Yes Historical Bethanne Mule, MD  aspirin 81 MG tablet Take 81 mg by mouth daily.     Yes Historical Willma Obando, MD  bisacodyl (DULCOLAX) 5 MG EC tablet Take 5 mg by mouth daily as needed for moderate constipation.   Yes Historical Shaundrea Carrigg, MD  escitalopram (LEXAPRO) 20 MG tablet Take 20 mg by mouth daily.   Yes Historical [place] Kretz, MD  fluticasone (FLONASE) 50 MCG/ACT nasal spray Place 1 spray into both nostrils daily.   Yes Historical Romualdo Prosise, MD  naproxen sodium (  ANAPROX) 550 MG tablet Take 550 mg by mouth 2 (two) times daily.     Yes Historical Daishia Fetterly, MD  omeprazole (PRILOSEC) 40 MG capsule Take 40 mg by mouth daily.   Yes Historical Sakiyah Shur, MD  traMADol (ULTRAM) 50 MG tablet Take 50 mg by mouth every 6 (six) hours as needed for moderate pain.   Yes Historical Leba Tibbitts, MD  traZODone (DESYREL) 100 MG tablet Take 100 mg by mouth at bedtime as needed for sleep.    Yes Historical Andromeda Poppen, MD   BP 129/78  Pulse 95  Temp(Src) 98.1 F (36.7 C) (Oral)  Resp 14  Ht 4\' 10"  (1.473 m)  Wt 91 lb (41.277 kg)  BMI 19.02 kg/m2  SpO2 100% Physical Exam  Nursing note and vitals reviewed. Constitutional: She appears well-developed and well-nourished. No distress.  HENT:  Head: Normocephalic and atraumatic.  Right Ear: External ear normal.  Left Ear: External ear normal.  Eyes: Conjunctivae are normal. Right eye exhibits no discharge. Left eye exhibits no discharge.  No scleral icterus.  Neck: Neck supple. No tracheal deviation present.  Cardiovascular: Normal rate, regular rhythm and intact distal pulses.   Pulmonary/Chest: Effort normal and breath sounds normal. No stridor. No respiratory distress. She has no wheezes. She has no rales.  Abdominal: Soft. Bowel sounds are normal. She exhibits no distension and no mass. There is tenderness in the epigastric area. There is no rebound and no guarding. No hernia.  Musculoskeletal: She exhibits no edema and no tenderness.  Neurological: She is alert. She has normal strength. No cranial nerve deficit (no facial droop, extraocular movements intact, no slurred speech) or sensory deficit. She exhibits normal muscle tone. She displays no seizure activity. Coordination normal.  Skin: Skin is warm and dry. No rash noted.  Psychiatric: She has a normal mood and affect.    ED Course  Procedures (including critical care time) Labs Review Labs Reviewed  CBC WITH DIFFERENTIAL - Abnormal; Notable for the following:    WBC 12.7 (*)    RBC 5.60 (*)    MCV 70.7 (*)    MCH 22.3 (*)    RDW 22.6 (*)    Neutrophils Relative % 80 (*)    Neutro Abs 10.1 (*)    Lymphocytes Relative 11 (*)    Monocytes Absolute 1.1 (*)    All other components within normal limits  COMPREHENSIVE METABOLIC PANEL - Abnormal; Notable for the following:    Sodium 132 (*)    Potassium 3.4 (*)    Chloride 92 (*)    Glucose, Bld 138 (*)    BUN 36 (*)    Creatinine, Ser 1.38 (*)    Albumin 2.9 (*)    Total Bilirubin 0.2 (*)    GFR calc non Af Amer 37 (*)    GFR calc Af Amer 43 (*)    All other components within normal limits  LIPASE, BLOOD - Abnormal; Notable for the following:    Lipase 82 (*)    All other components within normal limits  URINALYSIS, ROUTINE W REFLEX MICROSCOPIC    MDM   Pt has mild hypokalemia, hyponatremia and dehydration based on her initial labs.  Also with an increased lipase.  Her weight loss and symptoms for  the last month are concerning.    Will plan on abd ct scan here in the ED.  Dr Effie ShyWentz will follow up on the results and dispo accordingly.  Linwood DibblesJon Knapp, MD 05/12/14 [phone]

## 2014-05-12 NOTE — ED Provider Notes (Signed)
18:15-CT, is consistent with colitis, diffuse. Antibiotics were ordered, and admission arranged  Flint MelterElliott L Kasidee Voisin, MD 05/13/14 [phone]

## 2014-05-12 NOTE — ED Notes (Addendum)
Unable to obtain temperature. Pt drinking CT contrast.  Pt incontinent of stool on self. Per pt this is not like her. Pt cleaned and incontinence pad applied. MD notified.

## 2014-05-12 NOTE — ED Notes (Signed)
Pt has had several small diarrhea episodes since 1615. First on arrival at 1615.

## 2014-05-12 NOTE — ED Notes (Signed)
Pt reports worsening abdominal pain for a month coupled with n/v/d. Pt scheduled for CT Monday.

## 2014-05-12 NOTE — ED Notes (Signed)
Pt attempt to void unsuccessful. Will access urge to void for urine specimen shortly.

## 2014-05-12 NOTE — ED Notes (Signed)
MD at bedside. 

## 2014-05-12 NOTE — Progress Notes (Signed)
ANTIBIOTIC CONSULT NOTE - INITIAL  Pharmacy Consult for Cipro Indication: Intra-abdominal infection  No Known Allergies  Patient Measurements: Height: 4\' 10"  (147.3 cm) Weight: 91 lb (41.277 kg) IBW/kg (Calculated) : 40.9  Vital Signs: Temp: 98.2 F (36.8 C) (06/27 2039) Temp src: Oral (06/27 2039) BP: 120/57 mmHg (06/27 2039) Pulse Rate: 62 (06/27 2039)  Labs:  Recent Labs  05/12/14 1332  WBC 12.7*  HGB 12.5  PLT 394  CREATININE 1.38*   Estimated Creatinine Clearance: 23.4 ml/min (by C-G formula based on Cr of 1.38).  Microbiology: No results found for this or any previous visit (from the past 720 hour(s)).  Medical History: Past Medical History  Diagnosis Date  . GERD (gastroesophageal reflux disease)   . Insomnia   . Acute bronchitis   . Neck pain   . Back pain   . Allergic rhinitis     Medications:  Anti-infectives   Start     Dose/Rate Route Frequency Ordered Stop   05/13/14 0400  metroNIDAZOLE (FLAGYL) IVPB 500 mg     500 mg 100 mL/hr over 60 Minutes Intravenous Every 8 hours 05/12/14 2038     05/12/14 2100  fluconazole (DIFLUCAN) tablet 100 mg     100 mg Oral Daily 05/12/14 2038     05/12/14 1830  ciprofloxacin (CIPRO) IVPB 400 mg     400 mg 200 mL/hr over 60 Minutes Intravenous  Once 05/12/14 1826     05/12/14 1830  metroNIDAZOLE (FLAGYL) IVPB 500 mg     500 mg 100 mL/hr over 60 Minutes Intravenous  Once 05/12/14 1826 05/12/14 2017     Assessment: 72 yoF admitted 6/27 with concern for colitis.  Metronidazole and fluconazole per MD.  Pharmacy is consulted to dose Cipro.  Tmax: 98.2  WBCs: 12.7  Renal: SCr 1.38, CrCl ~ 23 ml/min   Goal of Therapy:  Appropriate abx dosing, eradication of infection.   Plan:   Cipro 400mg  IV q24h  Follow up renal function and adjust dose as needed.  Lynann Beaverhristine Alencia Gordon PharmD, BCPS Pager [phone] 05/12/2014 9:09 PM

## 2014-05-12 NOTE — H&P (Signed)
[facility] History and Physical  VICY MEDICO ZOX:[id number] DOB: 03/20/1941 DOA: 05/12/2014  Referring physician:  Effie Shy PCP:  Eartha Inch, MD   Chief Complaint:  diarrhea  HPI:  The patient is a 73 y.o. year-old female with history of acid reflux, chronic pain who presents with one-month history of diarrhea, abdominal discomfort, and 19 pound weight loss.  The patient was last at their baseline health about one month prior to admission.  She was seen by her primary care doctor who diagnosed her with iron deficiency anemia and started her on iron pills. About 4 days after starting her pills, she developed blowout diarrhea, which she described as very watery, nonbloody, nonmucoid C. Since that time she has had 4-5 watery stools per day mixed with occasional solid chunks. She also had nausea and vomiting approximately 4-5 times per day, nonbilious, nonbloody. After she has diarrhea, she develops 10 out of 10 cramping epigastric pain which radiates to both the right and left upper quadrants. This pain lasted for about one hour. Other than diarrhea, there is nothing that brings it on and nothing that she can do to relieve it on her own.  She denies any recent antibiotics or travel. She is unaware of anyone else in the family being sick with diarrhea. Her symptoms have stayed approximately the same for the last several weeks, and she is having 19 pound weight loss in the last month. She is normally followed by Doctor Loreta Ave and is due for colonoscopy. Her last colonoscopy was in 2007.  In the emergency department, her vital signs are stable. Her labs were notable for white blood cell count of 12.7. Her BMP was notable for sodium 132, potassium 3.4, BUN 36, creatinine 1.38, glucose 138. Her CT scan of the abdomen and pelvis demonstrated no evidence of obstruction, hydronephrosis. She has mild thickening of the colon wall and the right colon splenic flexure of the descending colon and sigmoid  colon, which was concerning for diffuse colitis. She was also noted to have some atherosclerotic calcifications of the abdominal aorta and iliac arteries. She is being admitted for colitis.  Review of Systems:  General:  Denies fevers, chills HEENT:  Denies changes to hearing and vision, rhinorrhea, sinus congestion, positive sore mouth for the last 2 weeks CV:  Denies chest pain and palpitations, lower extremity edema.  PULM:  Denies SOB, wheezing, cough.   GI:  Per history of present illness  GU:  Denies dysuria, frequency, urgency ENDO:  Denies polyuria, polydipsia.   HEME:  Denies hematemesis, blood in stools, melena, abnormal bruising or bleeding.  LYMPH:  Denies lymphadenopathy.   MSK:  Denies arthralgias, myalgias.   DERM:  Denies skin rash or ulcer.   NEURO:  Denies focal numbness, weakness, slurred speech, confusion, facial droop.  PSYCH:  Denies anxiety and depression.    Past Medical History  Diagnosis Date  . GERD (gastroesophageal reflux disease)   . Insomnia   . Acute bronchitis   . Neck pain   . Back pain   . Allergic rhinitis    Past Surgical History  Procedure Laterality Date  . Tubal ligation    . Vesicovaginal fistula closure w/ tah    . Abdominal hysterectomy     Social History:  reports that she has been smoking Cigarettes.  She has a 38 pack-year smoking history. She has never used smokeless tobacco. She reports that she does not drink alcohol or use illicit drugs.   No Known Allergies  Family  History  Problem Relation Age of Onset  . Emphysema Sister   . COPD Sister   . Allergies Daughter   . Clotting disorder Brother   . Arthritis Sister   . Heart disease Father   . Heart disease Brother   . Heart disease Sister   . Cancer Maternal Aunt   . Crohn's disease Neg Hx   . Ulcerative colitis Neg Hx   . Colon cancer Neg Hx      Prior to Admission medications   Medication Sig Start Date End Date Taking? Authorizing Provider  alendronate (FOSAMAX)  70 MG tablet Take 70 mg by mouth every 7 (seven) days. Take with a full glass of water on an empty stomach.   sunday   Yes Historical Provider, MD  aspirin 81 MG tablet Take 81 mg by mouth daily.     Yes Historical Provider, MD  bisacodyl (DULCOLAX) 5 MG EC tablet Take 5 mg by mouth daily as needed for moderate constipation.   Yes Historical Provider, MD  escitalopram (LEXAPRO) 20 MG tablet Take 20 mg by mouth daily.   Yes Historical Provider, MD  fluticasone (FLONASE) 50 MCG/ACT nasal spray Place 1 spray into both nostrils daily.   Yes Historical Provider, MD  naproxen sodium (ANAPROX) 550 MG tablet Take 550 mg by mouth 2 (two) times daily.     Yes Historical Provider, MD  omeprazole (PRILOSEC) 40 MG capsule Take 40 mg by mouth daily.   Yes Historical Provider, MD  traMADol (ULTRAM) 50 MG tablet Take 50 mg by mouth every 6 (six) hours as needed for moderate pain.   Yes Historical Provider, MD  traZODone (DESYREL) 100 MG tablet Take 100 mg by mouth at bedtime as needed for sleep.    Yes Historical Provider, MD   Physical Exam: Filed Vitals:   05/12/14 1300 05/12/14 1610 05/12/14 1810  BP: 129/78 141/70 123/77  Pulse: 95 70 66  Temp: 98.1 F (36.7 C)  97.5 F (36.4 C)  TempSrc: Oral  Oral  Resp: 14 16 16   Height: 4\' 10"  (1.473 m)    Weight: 41.277 kg (91 lb)    SpO2: 100% 100% 97%     General:  Cachectic-appearing white female, no acute distress  Eyes:  PERRL, anicteric, non-injected.  ENT:  Nares clear.  OP clear, mild erythema of the walls of the mouth with thrush on the tong, palate, buccal mucosa.  MMM.  Neck:  Supple without TM or JVD.    Lymph:  No cervical, supraclavicular, or submandibular LAD.  Cardiovascular:  RRR, normal S1, S2, without m/r/g.  2+ pulses, warm extremities  Respiratory:  CTA bilaterally without increased WOB.  Abdomen:  Hyperactive BS.  Soft, nondistended, mild tenderness to palpation in the right upper quadrant without rebound or guarding.    Skin:   No rashes or focal lesions.  Musculoskeletal:  Normal bulk and tone.  No LE edema.  Psychiatric:  A & O x 4.  Appropriate affect.  Neurologic:  CN 3-12 intact.  5/5 strength.  Sensation intact.  Labs on Admission:  Basic Metabolic Panel:  Recent Labs Lab 05/12/14 1332  NA 132*  K 3.4*  CL 92*  CO2 24  GLUCOSE 138*  BUN 36*  CREATININE 1.38*  CALCIUM 8.7   Liver Function Tests:  Recent Labs Lab 05/12/14 1332  AST 10  ALT 9  ALKPHOS 108  BILITOT 0.2*  PROT 7.1  ALBUMIN 2.9*    Recent Labs Lab 05/12/14 1332  LIPASE  82*   No results found for this basename: AMMONIA,  in the last 168 hours CBC:  Recent Labs Lab 05/12/14 1332  WBC 12.7*  NEUTROABS 10.1*  HGB 12.5  HCT 39.6  MCV 70.7*  PLT 394   Cardiac Enzymes: No results found for this basename: CKTOTAL, CKMB, CKMBINDEX, TROPONINI,  in the last 168 hours  BNP (last 3 results) No results found for this basename: PROBNP,  in the last 8760 hours CBG: No results found for this basename: GLUCAP,  in the last 168 hours  Radiological Exams on Admission: Ct Abdomen Pelvis Wo Contrast  05/12/2014   CLINICAL DATA:  Intermittent vomiting and diarrhea for 30 days, loss 19 lb lost month  EXAM: CT ABDOMEN AND PELVIS WITHOUT CONTRAST  TECHNIQUE: Multidetector CT imaging of the abdomen and pelvis was performed following the standard protocol without IV contrast.  COMPARISON:  None.  FINDINGS: Sagittal images of the spine shows degenerative changes thoracolumbar spine. There is about 5 mm chronic anterolisthesis L4 on L5 vertebral body. Significant disc space flattening with vacuum disc phenomenon and endplate sclerotic changes at L4-L5 and L5-S1 level.  Lung bases shows mild emphysematous changes  Atherosclerotic calcifications of abdominal aorta and iliac arteries. No aortic aneurysm. Unenhanced liver shows no biliary ductal dilatation. No calcified gallstones are noted within gallbladder. Unenhanced pancreas, spleen and  adrenal glands are unremarkable. No nephrolithiasis. No hydronephrosis or hydroureter. No calcified ureteral calculi are noted.  There is no small bowel obstruction. Contrast material is noted of the way in the colon. There is mild gaseous distension of the hepatic flexure of the colon and transverse colon. There is no colonic obstruction. Mild thickening of the right colonic wall in the region of hepatic flexure and mild stranding of surrounding fat. Best seen in axial image 41. There is also thickening of the wall in and descending colon and sigmoid colon. Findings are highly suspicious for diffuse mild colitis. No pericolonic abscess is noted. The uterus is surgically absent. Urinary bladder is empty limiting its assessment. There is a subcutaneous nodule just inferior to left pubic symphysis axial images 76 measures 1.2 cm. Clinical correlation is necessary. No inguinal adenopathy. No destructive bony lesions are noted within pelvis.  IMPRESSION: 1. There is no evidence of small bowel or colonic obstruction. 2. No hydronephrosis or hydroureter.  No nephrolithiasis. 3. Nonspecific mild thickening of colonic wall in right colon splenic flexure of the colon descending colon and sigmoid colon. Mild gaseous distension of transverse colon. Findings are suspicious for diffuse colitis. Clinical correlation is necessary. 4. Atherosclerotic calcifications of abdominal aorta and iliac arteries. 5. Degenerative changes lumbar spine. 6. Surgically absent uterus.   Electronically Signed   By: Natasha MeadLiviu  Pop M.D.   On: 05/12/2014 17:06    EKG: pending  Assessment/Plan Active Problems:   Colitis   Generalized weakness   Severe protein-calorie malnutrition   Leukocytosis   Possible Acute kidney injury   Nausea and vomiting   Diarrhea   Thrush   Hypokalemia   Hyponatremia  ---  Colitis, possibly ischemic given her abdominal discomfort, age, and presence of atherosclerosis demonstrated on CT scan. Also consider  possibility of infectious colitis, including parasitic infection given the duration of her symptoms.  Mild elevation of lipase is more likely related to her frequent nausea and vomiting.   -  Clear liquid diet -  Start ciprofloxacin and Flagyl -  Stool culture, GI pathogen panel, stool ova and parasite, occult stool -  Consider GI consultation  in the morning, normally sees Dr. Loreta AveMann  Leukocytosis, likely secondary to colitis -  IV fluids and antibiotics -  Repeat CBC in a.m.  Hyponatremia, hypokalemia, likely secondary to dehydration -  IV fluids with potassium -  Repeat BMP in a.m.  Hyperglycemia, likely stress related -  Check A1c  Possible AKI, unknown baseline creatinine, likely secondary to dehydration from poor oral intake -  IVF and repeat in AM -  Hold naprosyn -  Renally dose medications and minimize nephrotoxins  Oral thrush -  HIV -  Start fluconazole  GERD, stable, continue PPI  Chronic pain, stable, continur ultram  Osteoporosis, hold alendronate during hospitalization  Insomnia, stable, continue trazodone - f/u ECG, check QTc Severe protein calorie malnutrition with 19-lb weight loss in last month -  Nutrition consultation -  Breeze supplements -  Advance diet as quickly as possible  Generalized weakness -  Falls precautions -  PT evaluation  Diet:  Clear liquid  Access:  PIV  IVF:  Yes  Proph:  Lovenox   Code Status: DO NOT RESUSCITATE Family Communication: Patient alone, however her daughter is coming into town tomorrow Disposition Plan: Admit to medical surgical   Time spent: 60 min Renae FickleSHORT, MACKENZIE [facility] Pager [phone]  If 7PM-7AM, please contact night-coverage [email or web address] Password [email or web address] 05/12/2014, 7:42 PM

## 2014-05-12 NOTE — ED Notes (Signed)
Patient states she has been having intermittent vomiting and diarrhea x 30 days. Patient has been to see her PCP. Patient has a CT scan scheduled for Monday. Patient states she has lost 19 pounds in the past month.

## 2014-05-12 NOTE — ED Notes (Signed)
Daughter/Holly contact information [phone]

## 2014-05-13 LAB — CBC
HEMATOCRIT: 32.5 % — AB (ref 36.0–46.0)
Hemoglobin: 10.2 g/dL — ABNORMAL LOW (ref 12.0–15.0)
MCH: 22.3 pg — ABNORMAL LOW (ref 26.0–34.0)
MCHC: 31.4 g/dL (ref 30.0–36.0)
MCV: 71.1 fL — AB (ref 78.0–100.0)
PLATELETS: 310 10*3/uL (ref 150–400)
RBC: 4.57 MIL/uL (ref 3.87–5.11)
RDW: 22.6 % — ABNORMAL HIGH (ref 11.5–15.5)
WBC: 9.3 10*3/uL (ref 4.0–10.5)

## 2014-05-13 LAB — BASIC METABOLIC PANEL
BUN: 14 mg/dL (ref 6–23)
CO2: 23 mEq/L (ref 19–32)
CREATININE: 0.84 mg/dL (ref 0.50–1.10)
Calcium: 7.2 mg/dL — ABNORMAL LOW (ref 8.4–10.5)
Chloride: 101 mEq/L (ref 96–112)
GFR calc non Af Amer: 67 mL/min — ABNORMAL LOW (ref >90)
GFR, EST AFRICAN AMERICAN: 78 mL/min — AB (ref >90)
Glucose, Bld: 104 mg/dL — ABNORMAL HIGH (ref 70–99)
Potassium: 3.3 mEq/L — ABNORMAL LOW (ref 3.7–5.3)
Sodium: 135 mEq/L — ABNORMAL LOW (ref 137–147)

## 2014-05-13 LAB — HEMOGLOBIN A1C
Hgb A1c MFr Bld: 5.8 % — ABNORMAL HIGH (ref <5.7)
Mean Plasma Glucose: 120 mg/dL — ABNORMAL HIGH (ref <117)

## 2014-05-13 LAB — CLOSTRIDIUM DIFFICILE BY PCR: Toxigenic C. Difficile by PCR: NEGATIVE

## 2014-05-13 MED ORDER — CIPROFLOXACIN IN D5W 400 MG/200ML IV SOLN
400.0000 mg | Freq: Two times a day (BID) | INTRAVENOUS | Status: DC
  Administered 2014-05-13 – 2014-05-15 (×5): 400 mg via INTRAVENOUS
  Filled 2014-05-13 (×7): qty 200

## 2014-05-13 MED ORDER — POTASSIUM CHLORIDE CRYS ER 20 MEQ PO TBCR
40.0000 meq | EXTENDED_RELEASE_TABLET | Freq: Once | ORAL | Status: AC
  Administered 2014-05-13: 40 meq via ORAL
  Filled 2014-05-13: qty 2

## 2014-05-13 NOTE — Progress Notes (Signed)
ANTIBIOTIC CONSULT NOTE - Follow Up  Pharmacy Consult for Cipro Indication: Intra-abdominal infection  No Known Allergies  Patient Measurements: Height: 4\' 10"  (147.3 cm) Weight: 91 lb (41.277 kg) IBW/kg (Calculated) : 40.9  Vital Signs: Temp: 98.4 F (36.9 C) (06/28 0621) Temp src: Oral (06/28 0621) BP: 116/50 mmHg (06/28 0621) Pulse Rate: 64 (06/28 0621)  Labs:  Recent Labs  05/12/14 1332 05/13/14 0524  WBC 12.7* 9.3  HGB 12.5 10.2*  PLT 394 310  CREATININE 1.38* 0.84   Estimated Creatinine Clearance: 38.5 ml/min (by C-G formula based on Cr of 0.84).  Microbiology: No results found for this or any previous visit (from the past 720 hour(s)).  Medical History: Past Medical History  Diagnosis Date  . GERD (gastroesophageal reflux disease)   . Insomnia   . Acute bronchitis   . Neck pain   . Back pain   . Allergic rhinitis     Medications:  Anti-infectives   Start     Dose/Rate Route Frequency Ordered Stop   05/13/14 2200  ciprofloxacin (CIPRO) IVPB 400 mg     400 mg 200 mL/hr over 60 Minutes Intravenous Every 24 hours 05/12/14 2109     05/13/14 0400  metroNIDAZOLE (FLAGYL) IVPB 500 mg     500 mg 100 mL/hr over 60 Minutes Intravenous Every 8 hours 05/12/14 2038     05/12/14 2100  fluconazole (DIFLUCAN) tablet 100 mg     100 mg Oral Daily 05/12/14 2038     05/12/14 1830  ciprofloxacin (CIPRO) IVPB 400 mg  Status:  Discontinued     400 mg 200 mL/hr over 60 Minutes Intravenous  Once 05/12/14 1826 05/12/14 2109   05/12/14 1830  metroNIDAZOLE (FLAGYL) IVPB 500 mg     500 mg 100 mL/hr over 60 Minutes Intravenous  Once 05/12/14 1826 05/12/14 2017     Assessment: 72 yoF admitted 6/27 with concern for colitis.  Metronidazole and fluconazole per MD.  Pharmacy is consulted to dose Cipro.  6/27 >> Flagyl >> 6/27 >> Cipro >> 6/27 >> Diflucan >>   Tmax: afebrile  WBCs: WNL, improved  Renal: SCr improved, CrCl now ~ 39 ml/min   Goal of Therapy:   Appropriate abx dosing, eradication of infection.   Plan:  1) Due to improved renal function, change Cipro to 400mg  IV q12 2) Continue to follow renal function   Hessie KnowsJustin M Legge, PharmD, BCPS Pager [phone] 05/13/2014 8:14 AM

## 2014-05-13 NOTE — Evaluation (Signed)
Physical Therapy Evaluation Patient Details Name: Jeannine Kittenlizabeth M Gajda MRN: [id number] DOB: [birth year] Today's Date: 05/13/2014   History of Present Illness  73 year-old female with history of acid reflux, chronic pain admitted 6/27 with diarrhea and epigastric pain which radiates to both the right and left upper quadrants and diagnosed with colitis  Clinical Impression  Pt admitted with above. Pt currently with functional limitations due to the deficits listed below (see PT Problem List).  Pt will benefit from skilled PT to increase their independence and safety with mobility to allow discharge to the venue listed below.  Pt reports increased weakness, fatigue, and disequilibrium today.  Pt agreeable to ST-SNF.     Follow Up Recommendations SNF    Equipment Recommendations  None recommended by PT    Recommendations for Other Services       Precautions / Restrictions Precautions Precautions: Fall Restrictions Weight Bearing Restrictions: No      Mobility  Bed Mobility Overal bed mobility: Modified Independent                Transfers Overall transfer level: Needs assistance Equipment used: None Transfers: Sit to/from Stand Sit to Stand: Min assist         General transfer comment: assist to steady upon standing  Ambulation/Gait Ambulation/Gait assistance: Min assist Ambulation Distance (Feet): 80 Feet Assistive device:  (pushed IV pole) Gait Pattern/deviations: Step-through pattern;Narrow base of support;Decreased stride length Gait velocity: decr   General Gait Details: pt reports feeling weak and with disequilibrium, occasional assist to steady, limited distance due to fatigue  Stairs            Wheelchair Mobility    Modified Rankin (Stroke Patients Only)       Balance                                             Pertinent Vitals/Pain Reports abdominal pain, activity to tolerance    Home Living Family/patient expects  to be discharged to:: Skilled nursing facility Living Arrangements: Alone                    Prior Function Level of Independence: Independent               Hand Dominance        Extremity/Trunk Assessment               Lower Extremity Assessment: Generalized weakness         Communication   Communication: No difficulties  Cognition Arousal/Alertness: Awake/alert Behavior During Therapy: WFL for tasks assessed/performed Overall Cognitive Status: Within Functional Limits for tasks assessed                      General Comments      Exercises        Assessment/Plan    PT Assessment Patient needs continued PT services  PT Diagnosis Difficulty walking;Generalized weakness   PT Problem List Decreased strength;Decreased balance;Decreased activity tolerance;Decreased mobility;Decreased knowledge of use of DME  PT Treatment Interventions DME instruction;Gait training;Functional mobility training;Patient/family education;Therapeutic activities;Therapeutic exercise;Balance training;Neuromuscular re-education   PT Goals (Current goals can be found in the Care Plan section) Acute Rehab PT Goals PT Goal Formulation: With patient Time For Goal Achievement: 05/20/14 Potential to Achieve Goals: Good    Frequency Min 3X/week   Barriers to discharge  Co-evaluation               End of Session Equipment Utilized During Treatment: Gait belt Activity Tolerance: Patient limited by fatigue Patient left: in bed;with call bell/phone within reach Nurse Communication: Mobility status         Time: 0850-0903 PT Time Calculation (min): 13 min   Charges:   PT Evaluation $Initial PT Evaluation Tier I: 1 Procedure PT Treatments $Gait Training: 8-22 mins   PT G Codes:          Jehiel Koepp,KATHrine E 05/13/2014, 10:42 AM Zenovia JarredKati Mouhamed Glassco, PT, DPT 05/13/2014 Pager: [phone]

## 2014-05-13 NOTE — Progress Notes (Signed)
Clinical Social Work Department BRIEF PSYCHOSOCIAL ASSESSMENT 05/13/2014  Patient:  Debra Dillon, Debra Dillon     Account Number:  [id number]     Admit date:  05/12/2014  Clinical Social Worker:  Levie Heritage  Date/Time:  05/13/2014 09:55 AM  Referred by:  Physician  Date Referred:  05/13/2014 Referred for  SNF Placement   Other Referral:   Interview type:  Patient Other interview type:    PSYCHOSOCIAL DATA Living Status:  ALONE Admitted from facility:   Level of care:   Primary support name:  Garret Reddish Primary support relationship to patient:  CHILD, ADULT Degree of support available:   strong    CURRENT CONCERNS Current Concerns  Post-Acute Placement   Other Concerns:    SOCIAL WORK ASSESSMENT / PLAN Met with Pt to discuss d/c plans.    Pt admitted that she is very weak and that it's probably not a good idea to return home alone, at this time.  Pt stated, too, that her daughter wouldn't be happy if she didn't go to rehab.  Pt's daughter is coming in today from [place] and Pt expects that her daughter will encourage her to go to a SNF.    Pt stated that, although she has never been a Pt at a SNF, she has visited several and worked in several.  She stated that she is willing to explore SNF as a d/c option and, ultimately, stated that she feels it's the best decision for her, at this point.    CSW provided Pt with a SNF list.    CSW thanked Pt for her time.   Assessment/plan status:  Psychosocial Support/Ongoing Assessment of Needs Other assessment/ plan:   Information/referral to community resources:   SNF listq    PATIENT'S/FAMILY'S RESPONSE TO PLAN OF CARE: Pt was calm, cooperative and pleasant.  Pt lamented the fact that she got sick now, as she is in the process of moving [place] to live with her daughter.  She stated that she knows that she needs to get herself healthy and strong before she continues on with the move.  To that end, she is ok with  going to a SNF.   Bernita Raisin, [place] Social Work [phone]

## 2014-05-13 NOTE — Progress Notes (Signed)
[facility] PROGRESS NOTE  Debra Dillon Thieme ZOX:[id number] DOB: [birth year] DOA: 05/12/2014 PCP: Eartha InchBADGER,MICHAEL C, MD  Assessment/Plan: 1. Colitis - supporitive therapy and IV antibiotics - Pt reports improvement in condition  2. Hypokalemia - replace orally and reassess next am.   Code Status: DNR Family Communication: none at bedside. Disposition Plan: Pending improvement in condition. With continued improvement will consider discharging in 1-2 days   Consultants:  None  Procedures:  None  Antibiotics:  Cipro and flagyl  HPI/Subjective: Pt has no new complaints. No acute issues overnight reported. She states that she feels better.  Objective: Filed Vitals:   05/13/14 1400  BP: 99/63  Pulse: 65  Temp: 98.5 F (36.9 C)  Resp: 18    Intake/Output Summary (Last 24 hours) at 05/13/14 1714 Last data filed at 05/13/14 1713  Gross per 24 hour  Intake 1883.75 ml  Output      0 ml  Net 1883.75 ml   Filed Weights   05/12/14 1300  Weight: 41.277 kg (91 lb)    Exam:   General:  Pt in nad, alert and awake  Cardiovascular: RRR, no mrg  Respiratory: CTA BL, no wheezes  Abdomen: soft, nd, tenderness at lower quadrants  Musculoskeletal: no cyanosis or clubbing   Data Reviewed: Basic Metabolic Panel:  Recent Labs Lab 05/12/14 1332 05/13/14 0524  NA 132* 135*  K 3.4* 3.3*  CL 92* 101  CO2 24 23  GLUCOSE 138* 104*  BUN 36* 14  CREATININE 1.38* 0.84  CALCIUM 8.7 7.2*   Liver Function Tests:  Recent Labs Lab 05/12/14 1332  AST 10  ALT 9  ALKPHOS 108  BILITOT 0.2*  PROT 7.1  ALBUMIN 2.9*    Recent Labs Lab 05/12/14 1332  LIPASE 82*   No results found for this basename: AMMONIA,  in the last 168 hours CBC:  Recent Labs Lab 05/12/14 1332 05/13/14 0524  WBC 12.7* 9.3  NEUTROABS 10.1*  --   HGB 12.5 10.2*  HCT 39.6 32.5*  MCV 70.7* 71.1*  PLT 394 310   Cardiac Enzymes: No results found for this basename: CKTOTAL, CKMB,  CKMBINDEX, TROPONINI,  in the last 168 hours BNP (last 3 results) No results found for this basename: PROBNP,  in the last 8760 hours CBG: No results found for this basename: GLUCAP,  in the last 168 hours  Recent Results (from the past 240 hour(s))  CLOSTRIDIUM DIFFICILE BY PCR     Status: None   Collection Time    05/12/14  4:20 PM      Result Value Ref Range Status   C difficile by pcr NEGATIVE  NEGATIVE Final   Comment: Performed at [facility]     Studies: Ct Abdomen Pelvis Wo Contrast  05/12/2014   CLINICAL DATA:  Intermittent vomiting and diarrhea for 30 days, loss 19 lb lost month  EXAM: CT ABDOMEN AND PELVIS WITHOUT CONTRAST  TECHNIQUE: Multidetector CT imaging of the abdomen and pelvis was performed following the standard protocol without IV contrast.  COMPARISON:  None.  FINDINGS: Sagittal images of the spine shows degenerative changes thoracolumbar spine. There is about 5 mm chronic anterolisthesis L4 on L5 vertebral body. Significant disc space flattening with vacuum disc phenomenon and endplate sclerotic changes at L4-L5 and L5-S1 level.  Lung bases shows mild emphysematous changes  Atherosclerotic calcifications of abdominal aorta and iliac arteries. No aortic aneurysm. Unenhanced liver shows no biliary ductal dilatation. No calcified gallstones are noted within gallbladder. Unenhanced pancreas, spleen  and adrenal glands are unremarkable. No nephrolithiasis. No hydronephrosis or hydroureter. No calcified ureteral calculi are noted.  There is no small bowel obstruction. Contrast material is noted of the way in the colon. There is mild gaseous distension of the hepatic flexure of the colon and transverse colon. There is no colonic obstruction. Mild thickening of the right colonic wall in the region of hepatic flexure and mild stranding of surrounding fat. Best seen in axial image 41. There is also thickening of the wall in and descending colon and sigmoid colon. Findings are  highly suspicious for diffuse mild colitis. No pericolonic abscess is noted. The uterus is surgically absent. Urinary bladder is empty limiting its assessment. There is a subcutaneous nodule just inferior to left pubic symphysis axial images 76 measures 1.2 cm. Clinical correlation is necessary. No inguinal adenopathy. No destructive bony lesions are noted within pelvis.  IMPRESSION: 1. There is no evidence of small bowel or colonic obstruction. 2. No hydronephrosis or hydroureter.  No nephrolithiasis. 3. Nonspecific mild thickening of colonic wall in right colon splenic flexure of the colon descending colon and sigmoid colon. Mild gaseous distension of transverse colon. Findings are suspicious for diffuse colitis. Clinical correlation is necessary. 4. Atherosclerotic calcifications of abdominal aorta and iliac arteries. 5. Degenerative changes lumbar spine. 6. Surgically absent uterus.   Electronically Signed   By: Natasha MeadLiviu  Pop Dillon.D.   On: 05/12/2014 17:06    Scheduled Meds: . sodium chloride   Intravenous STAT  . antiseptic oral rinse  15 mL Mouth Rinse q12n4p  . aspirin EC  81 mg Oral Daily  . chlorhexidine  15 mL Mouth Rinse BID  . ciprofloxacin  400 mg Intravenous Q12H  . enoxaparin (LOVENOX) injection  30 mg Subcutaneous Q24H  . escitalopram  20 mg Oral Daily  . feeding supplement (RESOURCE BREEZE)  1 Container Oral TID BM  . fluconazole  100 mg Oral Daily  . fluticasone  1 spray Each Nare Daily  . metronidazole  500 mg Intravenous Q8H  . multivitamin with minerals  1 tablet Oral Daily  . pantoprazole  80 mg Oral Daily   Continuous Infusions: . dextrose 5 % and 0.45 % NaCl with KCl 20 mEq/L 75 mL/hr at 05/13/14 0935      Time spent: > 35 minutes    Debra Dillon, Debra Dillon  [facility] Pager [phone]. If 7PM-7AM, please contact night-coverage at [email or web address], password [email or web address] 05/13/2014, 5:14 PM  LOS: 1 day

## 2014-05-13 NOTE — Progress Notes (Signed)
Met with Pt's daughter at bedside.  Pt daughter just arrived from out-of-town and was asking about possible SNF placement in [place].  After a thoughtful discussion, it was decided that it'd be best for Pt to receive rehab at a SNF here and that Pt's daughter will transport Pt back home with her to [place].  Pt to reside with her daughter post rehab.  CSW answered all SNF and insurance related questions.  CSW thanked Pt and her daughter for their time.  Bernita Raisin, [place] Social Work [phone]

## 2014-05-13 NOTE — Progress Notes (Signed)
Clinical Social Work Department CLINICAL SOCIAL WORK PLACEMENT NOTE 05/13/2014  Patient:  Debra Dillon,Debra Dillon  Account Number:  [id number] Admit date:  05/12/2014  Clinical Social Worker:  Doroteo GlassmanAMANDA SIMPSON, LCSWA  Date/time:  05/13/2014 09:59 AM  Clinical Social Work is seeking post-discharge placement for this patient at the following level of care:   SKILLED NURSING   (*CSW will update this form in Epic as items are completed)   05/13/2014  Patient/family provided with [facility] Department of Clinical Social Work's list of facilities offering this level of care within the geographic area requested by the patient (or if unable, by the patient's family).  05/13/2014  Patient/family informed of their freedom to choose among providers that offer the needed level of care, that participate in Medicare, Medicaid or managed care program needed by the patient, have an available bed and are willing to accept the patient.  05/13/2014  Patient/family informed of [facility]' ownership interest in [facility], as well as of the fact that they are under no obligation to receive care at this facility.  PASARR submitted to EDS on 05/13/2014 PASARR number received on 05/13/2014  FL2 transmitted to all facilities in geographic area requested by pt/family on  05/13/2014 FL2 transmitted to all facilities within larger geographic area on   Patient informed that his/her managed care company has contracts with or will negotiate with  certain facilities, including the following:     Patient/family informed of bed offers received:   Patient chooses bed at  Physician recommends and patient chooses bed at    Patient to be transferred to  on   Patient to be transferred to facility by  Patient and family notified of transfer on  Name of family member notified:    The following physician request were entered in Epic:   Additional Comments:  Providence CrosbyAmanda Simpson, LCSW Clinical Social  Work [phone]

## 2014-05-14 LAB — BASIC METABOLIC PANEL
BUN: 4 mg/dL — ABNORMAL LOW (ref 6–23)
CALCIUM: 7.1 mg/dL — AB (ref 8.4–10.5)
CO2: 21 meq/L (ref 19–32)
CREATININE: 0.75 mg/dL (ref 0.50–1.10)
Chloride: 108 mEq/L (ref 96–112)
GFR calc Af Amer: >90 mL/min (ref >90)
GFR calc non Af Amer: 82 mL/min — ABNORMAL LOW (ref >90)
Glucose, Bld: 100 mg/dL — ABNORMAL HIGH (ref 70–99)
Potassium: 3.7 mEq/L (ref 3.7–5.3)
Sodium: 139 mEq/L (ref 137–147)

## 2014-05-14 LAB — CBC
HCT: 30.6 % — ABNORMAL LOW (ref 36.0–46.0)
HEMOGLOBIN: 9.5 g/dL — AB (ref 12.0–15.0)
MCH: 22.5 pg — AB (ref 26.0–34.0)
MCHC: 31 g/dL (ref 30.0–36.0)
MCV: 72.5 fL — AB (ref 78.0–100.0)
Platelets: 279 10*3/uL (ref 150–400)
RBC: 4.22 MIL/uL (ref 3.87–5.11)
RDW: 23 % — ABNORMAL HIGH (ref 11.5–15.5)
WBC: 6.3 10*3/uL (ref 4.0–10.5)

## 2014-05-14 LAB — OVA AND PARASITE EXAMINATION
Ova and parasites: NONE SEEN
Special Requests: NORMAL

## 2014-05-14 LAB — HIV ANTIBODY (ROUTINE TESTING W REFLEX): HIV 1&2 Ab, 4th Generation: NONREACTIVE

## 2014-05-14 MED ORDER — LOPERAMIDE HCL 2 MG PO CAPS
4.0000 mg | ORAL_CAPSULE | Freq: Once | ORAL | Status: AC
  Administered 2014-05-14: 4 mg via ORAL
  Filled 2014-05-14: qty 2

## 2014-05-14 MED ORDER — PSYLLIUM 95 % PO PACK
1.0000 | PACK | Freq: Two times a day (BID) | ORAL | Status: DC
Start: 2014-05-14 — End: 2014-05-15
  Administered 2014-05-14: 1 via ORAL
  Filled 2014-05-14 (×3): qty 1

## 2014-05-14 MED ORDER — LOPERAMIDE HCL 2 MG PO CAPS: 2.0000 mg | ORAL_CAPSULE | ORAL | Status: DC | PRN

## 2014-05-14 NOTE — Progress Notes (Signed)
CSW assisting with d/c planning. Pt requesting SNF placement at [facility]. A decision is pending. [employer]kley Harveyauth is pending. CSW will send today's PT note to insurance once it is available.   Cori RazorJamie Haidinger LCSW [phone]

## 2014-05-14 NOTE — Progress Notes (Signed)
05/14/14 1438  Standardized Balance Assessment  Standardized Balance Assessment  Berg Balance Test  Berg Balance Test  Sit to Stand 4  Standing Unsupported 3  Sitting with Back Unsupported but Feet Supported on Floor or Stool 4  Stand to Sit 3  Transfers 2  Standing Unsupported with Eyes Closed 3  Standing Ubsupported with Feet Together 2  From Standing, Reach Forward with Outstretched Arm 3  From Standing Position, Pick up Object from Floor 3  From Standing Position, Turn to Look Behind Over each Shoulder 3  Turn 360 Degrees 1  Standing Unsupported, Alternately Place Feet on Step/Stool 1  Standing Unsupported, One Foot in Front 1  Standing on One Leg 0  Total Score 33   Felecia ShellingLori Kropski  PTA [facility]  Acute  Rehab Pager      [phone]

## 2014-05-14 NOTE — Progress Notes (Signed)
Physical Therapy Treatment Patient Details Name: Debra Dillon Perot MRN: [id number] DOB: 09/23/1941 Today's Date: 05/14/2014    History of Present Illness 73 year-old female with history of acid reflux, chronic pain admitted 6/27 with diarrhea and epigastric pain which radiates to both the right and left upper quadrants and diagnosed with colitis    PT Comments    Assisted pt OOB to amb limited distance due to MAX c/o weakness and Chronic Back Pain.  Very unsteady gait in which pt rerquired use of rails to steady self in hallway and use of furniture to steady self in room.  Pt would benefit from RW however declined stated "I never used one before".  Performed BERG balance test in which pt slored 33/56 indicating HIGH FALL RISK.  Greatest difficulty was one leg stance, 360 turn and step up.   Pt will need ST Rehab at SNF to address above issues and regain prior level of safe mobility to return home alone.   Follow Up Recommendations  SNF  will need at least x5/wk PT @ SNF   Equipment Recommendations  Rolling walker with 5" wheels  Prior to admission pt did not need any AD however will need to use a RW indicated by poor BERG Balance score and noted gait instability   Recommendations for Other Services       Precautions / Restrictions Precautions Precautions: Fall Restrictions Weight Bearing Restrictions: No    Mobility  Bed Mobility Overal bed mobility: Modified Independent             General bed mobility comments: increased time  Transfers Overall transfer level: Needs assistance Equipment used: None Transfers: Sit to/from Stand Sit to Stand: Min assist;Min guard         General transfer comment: assist to steady upon standing and control decend.  Ambulation/Gait Ambulation/Gait assistance: Min assist Ambulation Distance (Feet): 65 Feet Assistive device: None Gait Pattern/deviations: Step-through pattern;Decreased stride length;Narrow base of support;Decreased  stance time - left Gait velocity: decr   General Gait Details: Very unsteady gait with definite use of rail and furniturwe in room to staedy self.  Pt would benefit from RW but declined as she stated she did not use one prior to admission.    Stairs            Wheelchair Mobility    Modified Rankin (Stroke Patients Only)       Balance  BERG balance test performed pt scored 33/56 indicating HIGH FALL RISK                                  Cognition                            Exercises      General Comments General comments (skin integrity, edema, etc.): pt c/o "bad back" and "my left leg is weaker than the other".      Pertinent Vitals/Pain C/o back pain meds requested    Home Living    alone                  Prior Function            PT Goals (current goals can now be found in the care plan section) Progress towards PT goals: Progressing toward goals    Frequency  Min 3X/week during acute hospital stay    PT  Plan  SNF ST Rehab    Co-evaluation             End of Session Equipment Utilized During Treatment: Gait belt Activity Tolerance: Patient limited by fatigue;Patient limited by pain (chronic back pain)       Time: [id number] PT Time Calculation (min): 24 min  Charges:  $Gait Training: 8-22 mins $Therapeutic Activity: 8-22 mins                    G Codes:      Felecia ShellingLori Roshawna Colclasure  PTA [facility]  Acute  Rehab Pager      [phone]

## 2014-05-14 NOTE — Progress Notes (Signed)
[facility] PROGRESS NOTE  Debra Dillon [id number] DOB: 1941-04-23 DOA: 05/12/2014 PCP: Eartha InchBADGER,MICHAEL C, MD  Assessment/Plan: 1. Colitis - supporitive therapy and IV antibiotics - Pt reports improvement in condition - Pt still complaining of diarrhea, c diff negative as such will place on loperamide and bulk up with metamucil - Advance diet to regular diet.  2. Hypokalemia - resolved after oral replacement  3. DVT prophylaxis - Lovenox   Code Status: DNR Family Communication: none at bedside. Disposition Plan: Plan is for d/c to SNF   Consultants:  None  Procedures:  None  Antibiotics:  Cipro and flagyl  HPI/Subjective: Pt has no new complaints. No acute issues overnight reported. She states that she feels better.  Objective: Filed Vitals:   05/14/14 1410  BP: 99/61  Pulse: 64  Temp: 98 F (36.7 C)  Resp: 19    Intake/Output Summary (Last 24 hours) at 05/14/14 1607 Last data filed at 05/14/14 1411  Gross per 24 hour  Intake   2580 ml  Output    450 ml  Net   2130 ml   Filed Weights   05/12/14 1300  Weight: 41.277 kg (91 lb)    Exam:   General:  Pt in nad, alert and awake  Cardiovascular: RRR, no mrg  Respiratory: CTA BL, no wheezes  Abdomen: soft, nd, tenderness at lower quadrants  Musculoskeletal: no cyanosis or clubbing   Data Reviewed: Basic Metabolic Panel:  Recent Labs Lab 05/12/14 1332 05/13/14 0524 05/14/14 0525  NA 132* 135* 139  K 3.4* 3.3* 3.7  CL 92* 101 108  CO2 24 23 21   GLUCOSE 138* 104* 100*  BUN 36* 14 4*  CREATININE 1.38* 0.84 0.75  CALCIUM 8.7 7.2* 7.1*   Liver Function Tests:  Recent Labs Lab 05/12/14 1332  AST 10  ALT 9  ALKPHOS 108  BILITOT 0.2*  PROT 7.1  ALBUMIN 2.9*    Recent Labs Lab 05/12/14 1332  LIPASE 82*   No results found for this basename: AMMONIA,  in the last 168 hours CBC:  Recent Labs Lab 05/12/14 1332 05/13/14 0524 05/14/14 0525  WBC 12.7* 9.3 6.3   NEUTROABS 10.1*  --   --   HGB 12.5 10.2* 9.5*  HCT 39.6 32.5* 30.6*  MCV 70.7* 71.1* 72.5*  PLT 394 310 279   Cardiac Enzymes: No results found for this basename: CKTOTAL, CKMB, CKMBINDEX, TROPONINI,  in the last 168 hours BNP (last 3 results) No results found for this basename: PROBNP,  in the last 8760 hours CBG: No results found for this basename: GLUCAP,  in the last 168 hours  Recent Results (from the past 240 hour(s))  STOOL CULTURE     Status: None   Collection Time    05/12/14  4:20 PM      Result Value Ref Range Status   Specimen Description STOOL   Final   Special Requests Normal   Final   Culture     Final   Value: NO SUSPICIOUS COLONIES, CONTINUING TO HOLD     Performed at [facility]   Report Status PENDING   Incomplete  CLOSTRIDIUM DIFFICILE BY PCR     Status: None   Collection Time    05/12/14  4:20 PM      Result Value Ref Range Status   C difficile by pcr NEGATIVE  NEGATIVE Final   Comment: Performed at [facility]  OVA AND PARASITE EXAMINATION     Status: None  Collection Time    05/12/14 10:59 PM      Result Value Ref Range Status   Specimen Description STOOL   Final   Special Requests Normal   Final   Ova and parasites     Final   Value: NO OVA OR PARASITES SEEN     Performed at [facility]   Report Status 05/14/2014 FINAL   Final     Studies: Ct Abdomen Pelvis Wo Contrast  05/12/2014   CLINICAL DATA:  Intermittent vomiting and diarrhea for 30 days, loss 19 lb lost month  EXAM: CT ABDOMEN AND PELVIS WITHOUT CONTRAST  TECHNIQUE: Multidetector CT imaging of the abdomen and pelvis was performed following the standard protocol without IV contrast.  COMPARISON:  None.  FINDINGS: Sagittal images of the spine shows degenerative changes thoracolumbar spine. There is about 5 mm chronic anterolisthesis L4 on L5 vertebral body. Significant disc space flattening with vacuum disc phenomenon and endplate sclerotic changes at L4-L5 and  L5-S1 level.  Lung bases shows mild emphysematous changes  Atherosclerotic calcifications of abdominal aorta and iliac arteries. No aortic aneurysm. Unenhanced liver shows no biliary ductal dilatation. No calcified gallstones are noted within gallbladder. Unenhanced pancreas, spleen and adrenal glands are unremarkable. No nephrolithiasis. No hydronephrosis or hydroureter. No calcified ureteral calculi are noted.  There is no small bowel obstruction. Contrast material is noted of the way in the colon. There is mild gaseous distension of the hepatic flexure of the colon and transverse colon. There is no colonic obstruction. Mild thickening of the right colonic wall in the region of hepatic flexure and mild stranding of surrounding fat. Best seen in axial image 41. There is also thickening of the wall in and descending colon and sigmoid colon. Findings are highly suspicious for diffuse mild colitis. No pericolonic abscess is noted. The uterus is surgically absent. Urinary bladder is empty limiting its assessment. There is a subcutaneous nodule just inferior to left pubic symphysis axial images 76 measures 1.2 cm. Clinical correlation is necessary. No inguinal adenopathy. No destructive bony lesions are noted within pelvis.  IMPRESSION: 1. There is no evidence of small bowel or colonic obstruction. 2. No hydronephrosis or hydroureter.  No nephrolithiasis. 3. Nonspecific mild thickening of colonic wall in right colon splenic flexure of the colon descending colon and sigmoid colon. Mild gaseous distension of transverse colon. Findings are suspicious for diffuse colitis. Clinical correlation is necessary. 4. Atherosclerotic calcifications of abdominal aorta and iliac arteries. 5. Degenerative changes lumbar spine. 6. Surgically absent uterus.   Electronically Signed   By: Natasha Mead M.D.   On: 05/12/2014 17:06    Scheduled Meds: . antiseptic oral rinse  15 mL Mouth Rinse q12n4p  . aspirin EC  81 mg Oral Daily  .  chlorhexidine  15 mL Mouth Rinse BID  . ciprofloxacin  400 mg Intravenous Q12H  . enoxaparin (LOVENOX) injection  30 mg Subcutaneous Q24H  . escitalopram  20 mg Oral Daily  . feeding supplement (RESOURCE BREEZE)  1 Container Oral TID BM  . fluconazole  100 mg Oral Daily  . fluticasone  1 spray Each Nare Daily  . metronidazole  500 mg Intravenous Q8H  . multivitamin with minerals  1 tablet Oral Daily  . pantoprazole  80 mg Oral Daily  . psyllium  1 packet Oral BID   Continuous Infusions: . dextrose 5 % and 0.45 % NaCl with KCl 20 mEq/L 75 mL/hr at 05/14/14 1610      Time spent: >  35 minutes    [name], Godfrey Tritschler  [facility] Pager [phone]. If 7PM-7AM, please contact night-coverage at [email or web address], password [email or web address] 05/14/2014, 4:07 PM  LOS: 2 days

## 2014-05-14 NOTE — Progress Notes (Signed)
INITIAL NUTRITION ASSESSMENT  Pt meets criteria for severe MALNUTRITION in the context of chronic illness as evidenced by <75% estimated energy intake with 17% weight loss in the past month.  DOCUMENTATION CODES Per approved criteria  -Severe malnutrition in the context of chronic illness   INTERVENTION: - Diet advancement per MD - Continue Resource Breeze TID - Recommend Florastor to help with diarrhea - Encouraged bland food intake once diet advanced  - RD to continue to monitor   NUTRITION DIAGNOSIS: Altered GI function related to diarrhea as evidenced by pt report.    Goal: 1. Resolution of diarrhea 2. Advance diet as tolerated to soft diet  Monitor:  Weights, labs, diet advancement, diarrhea   Reason for Assessment: Malnutrition screening tool, consult for assessment    73 y.o. female  Admitting Dx: Diarrhea   ASSESSMENT: Pt with history of acid reflux, chronic pain who presents with one-month history of diarrhea, abdominal discomfort, and 19 pound weight loss. Found to have colitis.   - Pt and daughter in room - Pt reports intake at home has been foods that her stomach could hold down like chicken salad, toast, and crackers, but progressed to feeling so bad that nothing was staying down, everything was going through - Tried to hydrate herself with Gatorade at home as well as lemonade - C/o still having diarrhea but number of times has improved, has gone 4 times today - No vomiting today - Had some broth, jello, and juice but said it went through her due to diarrhea  Lipase elevated    Height: Ht Readings from Last 1 Encounters:  05/12/14 4\' 10"  (1.473 m)    Weight: Wt Readings from Last 1 Encounters:  05/12/14 91 lb (41.277 kg)    Ideal Body Weight: 97 lbs   % Ideal Body Weight: 94%  Wt Readings from Last 10 Encounters:  05/12/14 91 lb (41.277 kg)  03/29/13 93 lb 3 oz (42.27 kg)  06/19/11 108 lb 12.8 oz (49.351 kg)    Usual Body Weight: 110 lbs  per pt   % Usual Body Weight: 83%  BMI:  Body mass index is 19.02 kg/(m^2).  Estimated Nutritional Needs: Kcal: 1100-1300 Protein: 50-70g Fluid: 1.1-1.3L/day   Skin: Intact   Diet Order: Clear Liquid  EDUCATION NEEDS: -No education needs identified at this time   Intake/Output Summary (Last 24 hours) at 05/14/14 1408 Last data filed at 05/14/14 1234  Gross per 24 hour  Intake   2340 ml  Output    450 ml  Net   1890 ml    Last BM: 6/29  Labs:   Recent Labs Lab 05/12/14 1332 05/13/14 0524 05/14/14 0525  NA 132* 135* 139  K 3.4* 3.3* 3.7  CL 92* 101 108  CO2 24 23 21   BUN 36* 14 4*  CREATININE 1.38* 0.84 0.75  CALCIUM 8.7 7.2* 7.1*  GLUCOSE 138* 104* 100*    CBG (last 3)  No results found for this basename: GLUCAP,  in the last 72 hours  Scheduled Meds: . antiseptic oral rinse  15 mL Mouth Rinse q12n4p  . aspirin EC  81 mg Oral Daily  . chlorhexidine  15 mL Mouth Rinse BID  . ciprofloxacin  400 mg Intravenous Q12H  . enoxaparin (LOVENOX) injection  30 mg Subcutaneous Q24H  . escitalopram  20 mg Oral Daily  . feeding supplement (RESOURCE BREEZE)  1 Container Oral TID BM  . fluconazole  100 mg Oral Daily  . fluticasone  1  spray Each Nare Daily  . metronidazole  500 mg Intravenous Q8H  . multivitamin with minerals  1 tablet Oral Daily  . pantoprazole  80 mg Oral Daily    Continuous Infusions: . dextrose 5 % and 0.45 % NaCl with KCl 20 mEq/L 75 mL/hr at 05/14/14 [id number]    Past Medical History  Diagnosis Date  . GERD (gastroesophageal reflux disease)   . Insomnia   . Acute bronchitis   . Neck pain   . Back pain   . Allergic rhinitis     Past Surgical History  Procedure Laterality Date  . Tubal ligation    . Vesicovaginal fistula closure w/ tah    . Abdominal hysterectomy      Charlott RakesHeather Winkler MS, RD, LDN [phone] Pager [phone] Weekend/After Hours Pager

## 2014-05-15 LAB — BASIC METABOLIC PANEL
BUN: <3 mg/dL — ABNORMAL LOW (ref 6–23)
CHLORIDE: 106 meq/L (ref 96–112)
CO2: 23 meq/L (ref 19–32)
Calcium: 7.2 mg/dL — ABNORMAL LOW (ref 8.4–10.5)
Creatinine, Ser: 0.69 mg/dL (ref 0.50–1.10)
GFR calc Af Amer: >90 mL/min (ref >90)
GFR calc non Af Amer: 84 mL/min — ABNORMAL LOW (ref >90)
GLUCOSE: 103 mg/dL — AB (ref 70–99)
Potassium: 3.8 mEq/L (ref 3.7–5.3)
Sodium: 138 mEq/L (ref 137–147)

## 2014-05-15 LAB — GI PATHOGEN PANEL BY PCR, STOOL
C difficile toxin A/B: NEGATIVE
CRYPTOSPORIDIUM BY PCR: NEGATIVE
Campylobacter by PCR: NEGATIVE
E coli (ETEC) LT/ST: NEGATIVE
E coli (STEC): NEGATIVE
E coli 0157 by PCR: NEGATIVE
G LAMBLIA BY PCR: NEGATIVE
NOROVIRUS G1/G2: NEGATIVE
Rotavirus A by PCR: NEGATIVE
SHIGELLA BY PCR: NEGATIVE
Salmonella by PCR: NEGATIVE

## 2014-05-15 LAB — CBC
HEMATOCRIT: 30.7 % — AB (ref 36.0–46.0)
HEMOGLOBIN: 9.5 g/dL — AB (ref 12.0–15.0)
MCH: 22.5 pg — ABNORMAL LOW (ref 26.0–34.0)
MCHC: 30.9 g/dL (ref 30.0–36.0)
MCV: 72.7 fL — ABNORMAL LOW (ref 78.0–100.0)
Platelets: 308 10*3/uL (ref 150–400)
RBC: 4.22 MIL/uL (ref 3.87–5.11)
RDW: 23.8 % — ABNORMAL HIGH (ref 11.5–15.5)
WBC: 7.4 10*3/uL (ref 4.0–10.5)

## 2014-05-15 MED ORDER — LOPERAMIDE HCL 2 MG PO CAPS: 2.0000 mg | ORAL_CAPSULE | ORAL | Status: AC | PRN

## 2014-05-15 MED ORDER — CIPROFLOXACIN HCL 500 MG PO TABS: 500.0000 mg | ORAL_TABLET | Freq: Two times a day (BID) | ORAL | Status: AC

## 2014-05-15 MED ORDER — TRAMADOL HCL 50 MG PO TABS: 50.0000 mg | ORAL_TABLET | Freq: Four times a day (QID) | ORAL | Status: AC | PRN

## 2014-05-15 MED ORDER — BOOST / RESOURCE BREEZE PO LIQD: 1.0000 | Freq: Three times a day (TID) | ORAL | Status: AC

## 2014-05-15 MED ORDER — METRONIDAZOLE 500 MG PO TABS
500.0000 mg | ORAL_TABLET | Freq: Three times a day (TID) | ORAL | Status: AC
Start: 2014-05-15 — End: ?

## 2014-05-15 NOTE — Discharge Summary (Signed)
Physician Discharge Summary  Debra Dillon OZH:[id number] DOB: March 02, 1941 DOA: 05/12/2014  PCP: Eartha Inch, MD  Admit date: 05/12/2014 Discharge date: 05/15/2014  Time spent: > 35 minutes  Recommendations for Outpatient Follow-up:  1. Pt to continue PT at SNF  Discharge Diagnoses:  Active Problems:   Colitis   Generalized weakness   Severe protein-calorie malnutrition   Nausea and vomiting   Diarrhea   Thrush   Hypokalemia   Hyponatremia   Discharge Condition: stable  Diet recommendation: regular diet  Filed Weights   05/12/14 1300  Weight: 41.277 kg (91 lb)    History of present illness:  73 y/o with history of acid reflux, chronic pain who presents with one-month history of diarrhea, abdominal discomfort, and 19 pound weight loss.  Reports history of poor oral intake and progressive weakness. CT scan of the abdomen and pelvis demonstrated no evidence of obstruction, hydronephrosis. She has mild thickening of the colon wall and the right colon splenic flexure of the descending colon and sigmoid colon, which was concerning for diffuse colitis   Hospital Course:  Colitis - Improved on Cipro and flagyl. Will d/c with 7 more days of antibiotic therapy to complete a 10 day total treatment.  Diarrhea - resolving on imodium - c diff negative - no ova or parasite on stool culture  Severe protein and calorie malnutrition - liberalize diet, d/c with nutritional supplementation - d/c to SNF  Thrush - treated with diflucan for 4 days - no complaints on d/c as such will discontinue  Hypokalemia - resolved, most likely due to poor oral intake  Hyponatremia - resolved, most likely due to poor oral intake  Procedures:  Ct of abdomen and pelvis  Consultations:  none  Discharge Exam: Filed Vitals:   05/15/14 0534  BP: 101/66  Pulse: 78  Temp: 99 F (37.2 C)  Resp: 18    General: Pt in NAD, alert and awake Cardiovascular: RRR, no MRG Respiratory:  CTA BL, no wheezes  Discharge Instructions You were cared for by a hospitalist during your hospital stay. If you have any questions about your discharge medications or the care you received while you were in the hospital after you are discharged, you can call the unit and asked to speak with the hospitalist on call if the hospitalist that took care of you is not available. Once you are discharged, your primary care physician will handle any further medical issues. Please note that NO REFILLS for any discharge medications will be authorized once you are discharged, as it is imperative that you return to your primary care physician (or establish a relationship with a primary care physician if you do not have one) for your aftercare needs so that they can reassess your need for medications and monitor your lab values.  Discharge Instructions   Call MD for:  persistant nausea and vomiting    Complete by:  As directed      Call MD for:  severe uncontrolled pain    Complete by:  As directed      Call MD for:  temperature >100.4    Complete by:  As directed      Diet - low sodium heart healthy    Complete by:  As directed      Increase activity slowly    Complete by:  As directed             Medication List         alendronate 70 MG tablet  Commonly known as:  FOSAMAX  Take 70 mg by mouth every 7 (seven) days. Take with a full glass of water on an empty stomach.   sunday     aspirin 81 MG tablet  Take 81 mg by mouth daily.     bisacodyl 5 MG EC tablet  Commonly known as:  DULCOLAX  Take 5 mg by mouth daily as needed for moderate constipation.     ciprofloxacin 500 MG tablet  Commonly known as:  CIPRO  Take 1 tablet (500 mg total) by mouth 2 (two) times daily.     escitalopram 20 MG tablet  Commonly known as:  LEXAPRO  Take 20 mg by mouth daily.     feeding supplement (RESOURCE BREEZE) Liqd  Take 1 Container by mouth 3 (three) times daily between meals.     fluticasone 50 MCG/ACT  nasal spray  Commonly known as:  FLONASE  Place 1 spray into both nostrils daily.     loperamide 2 MG capsule  Commonly known as:  IMODIUM  Take 1 capsule (2 mg total) by mouth as needed for diarrhea or loose stools.     metroNIDAZOLE 500 MG tablet  Commonly known as:  FLAGYL  Take 1 tablet (500 mg total) by mouth 3 (three) times daily.     naproxen sodium 550 MG tablet  Commonly known as:  ANAPROX  Take 550 mg by mouth 2 (two) times daily.     omeprazole 40 MG capsule  Commonly known as:  PRILOSEC  Take 40 mg by mouth daily.     traMADol 50 MG tablet  Commonly known as:  ULTRAM  Take 1 tablet (50 mg total) by mouth every 6 (six) hours as needed for moderate pain.     traZODone 100 MG tablet  Commonly known as:  DESYREL  Take 100 mg by mouth at bedtime as needed for sleep.       No Known Allergies    The results of significant diagnostics from this hospitalization (including imaging, microbiology, ancillary and laboratory) are listed below for reference.    Significant Diagnostic Studies: Ct Abdomen Pelvis Wo Contrast  05/12/2014   CLINICAL DATA:  Intermittent vomiting and diarrhea for 30 days, loss 19 lb lost month  EXAM: CT ABDOMEN AND PELVIS WITHOUT CONTRAST  TECHNIQUE: Multidetector CT imaging of the abdomen and pelvis was performed following the standard protocol without IV contrast.  COMPARISON:  None.  FINDINGS: Sagittal images of the spine shows degenerative changes thoracolumbar spine. There is about 5 mm chronic anterolisthesis L4 on L5 vertebral body. Significant disc space flattening with vacuum disc phenomenon and endplate sclerotic changes at L4-L5 and L5-S1 level.  Lung bases shows mild emphysematous changes  Atherosclerotic calcifications of abdominal aorta and iliac arteries. No aortic aneurysm. Unenhanced liver shows no biliary ductal dilatation. No calcified gallstones are noted within gallbladder. Unenhanced pancreas, spleen and adrenal glands are  unremarkable. No nephrolithiasis. No hydronephrosis or hydroureter. No calcified ureteral calculi are noted.  There is no small bowel obstruction. Contrast material is noted of the way in the colon. There is mild gaseous distension of the hepatic flexure of the colon and transverse colon. There is no colonic obstruction. Mild thickening of the right colonic wall in the region of hepatic flexure and mild stranding of surrounding fat. Best seen in axial image 41. There is also thickening of the wall in and descending colon and sigmoid colon. Findings are highly suspicious for diffuse mild colitis. No pericolonic abscess is  noted. The uterus is surgically absent. Urinary bladder is empty limiting its assessment. There is a subcutaneous nodule just inferior to left pubic symphysis axial images 76 measures 1.2 cm. Clinical correlation is necessary. No inguinal adenopathy. No destructive bony lesions are noted within pelvis.  IMPRESSION: 1. There is no evidence of small bowel or colonic obstruction. 2. No hydronephrosis or hydroureter.  No nephrolithiasis. 3. Nonspecific mild thickening of colonic wall in right colon splenic flexure of the colon descending colon and sigmoid colon. Mild gaseous distension of transverse colon. Findings are suspicious for diffuse colitis. Clinical correlation is necessary. 4. Atherosclerotic calcifications of abdominal aorta and iliac arteries. 5. Degenerative changes lumbar spine. 6. Surgically absent uterus.   Electronically Signed   By: Natasha MeadLiviu  Pop M.D.   On: 05/12/2014 17:06    Microbiology: Recent Results (from the past 240 hour(s))  STOOL CULTURE     Status: None   Collection Time    05/12/14  4:20 PM      Result Value Ref Range Status   Specimen Description STOOL   Final   Special Requests Normal   Final   Culture     Final   Value: NO SUSPICIOUS COLONIES, CONTINUING TO HOLD     Performed at [facility]   Report Status PENDING   Incomplete  CLOSTRIDIUM DIFFICILE  BY PCR     Status: None   Collection Time    05/12/14  4:20 PM      Result Value Ref Range Status   C difficile by pcr NEGATIVE  NEGATIVE Final   Comment: Performed at [facility]  OVA AND PARASITE EXAMINATION     Status: None   Collection Time    05/12/14 10:59 PM      Result Value Ref Range Status   Specimen Description STOOL   Final   Special Requests Normal   Final   Ova and parasites     Final   Value: NO OVA OR PARASITES SEEN     Performed at [facility]   Report Status 05/14/2014 FINAL   Final     Labs: Basic Metabolic Panel:  Recent Labs Lab 05/12/14 1332 05/13/14 0524 05/14/14 0525 05/15/14 0520  NA 132* 135* 139 138  K 3.4* 3.3* 3.7 3.8  CL 92* 101 108 106  CO2 24 23 21 23   GLUCOSE 138* 104* 100* 103*  BUN 36* 14 4* <3*  CREATININE 1.38* 0.84 0.75 0.69  CALCIUM 8.7 7.2* 7.1* 7.2*   Liver Function Tests:  Recent Labs Lab 05/12/14 1332  AST 10  ALT 9  ALKPHOS 108  BILITOT 0.2*  PROT 7.1  ALBUMIN 2.9*    Recent Labs Lab 05/12/14 1332  LIPASE 82*   No results found for this basename: AMMONIA,  in the last 168 hours CBC:  Recent Labs Lab 05/12/14 1332 05/13/14 0524 05/14/14 0525 05/15/14 0520  WBC 12.7* 9.3 6.3 7.4  NEUTROABS 10.1*  --   --   --   HGB 12.5 10.2* 9.5* 9.5*  HCT 39.6 32.5* 30.6* 30.7*  MCV 70.7* 71.1* 72.5* 72.7*  PLT 394 310 279 308   Cardiac Enzymes: No results found for this basename: CKTOTAL, CKMB, CKMBINDEX, TROPONINI,  in the last 168 hours BNP: BNP (last 3 results) No results found for this basename: PROBNP,  in the last 8760 hours CBG: No results found for this basename: GLUCAP,  in the last 168 hours     Signed:  [name], [name]  [facility] 05/15/2014, 2:05 PM

## 2014-05-15 NOTE — Progress Notes (Signed)
Clinical Social Work Department CLINICAL SOCIAL WORK PLACEMENT NOTE 05/15/2014  Patient:  Debra Dillon,Debra M  Account Number:  [id number] Admit date:  05/12/2014  Clinical Social Worker:  Doroteo GlassmanAMANDA SIMPSON, LCSWA  Date/time:  05/13/2014 09:59 AM  Clinical Social Work is seeking post-discharge placement for this patient at the following level of care:   SKILLED NURSING   (*CSW will update this form in Epic as items are completed)   05/13/2014  Patient/family provided with [facility] Department of Clinical Social Work's list of facilities offering this level of care within the geographic area requested by the patient (or if unable, by the patient's family).  05/13/2014  Patient/family informed of their freedom to choose among providers that offer the needed level of care, that participate in Medicare, Medicaid or managed care program needed by the patient, have an available bed and are willing to accept the patient.  05/13/2014  Patient/family informed of [facility]' ownership interest in [facility], as well as of the fact that they are under no obligation to receive care at this facility.  PASARR submitted to EDS on 05/13/2014 PASARR number received on 05/13/2014  FL2 transmitted to all facilities in geographic area requested by pt/family on  05/13/2014 FL2 transmitted to all facilities within larger geographic area on   Patient informed that his/her managed care company has contracts with or will negotiate with  certain facilities, including the following:     Patient/family informed of bed offers received:  05/15/2014 Patient chooses bed at [facility] Physician recommends and patient chooses bed at    [facility] on  05/15/2014 Patient to be transferred to facility by Daughter Patient and family notified of transfer on 05/15/2014 Name of family member notified:  Daughter  The following physician request were entered in  Epic:   Additional Comments: Pt / daughter are in agreement with d/c plan to SNF today. NSG reviewed d/c summary, avs, scripts. Scripts were included in d/c packet. [employer] Medicare provided authorization for SNF placement.  Cori RazorJamie Haidinger LCSW [phone]

## 2014-05-15 NOTE — Progress Notes (Signed)
Discharge packet given to patients daughter at time of discharge. Patient's daughter instructed to give discharge packet to [facility].  Nurse called report to receiving nurse at [facility].  Patient's daughter to transport patient to SNF [facility]  No concerns at time of discharge.

## 2014-05-16 ENCOUNTER — Encounter: Payer: Self-pay | Admitting: Adult Health

## 2014-05-16 ENCOUNTER — Non-Acute Institutional Stay (SKILLED_NURSING_FACILITY): Payer: Medicare Other | Admitting: Adult Health

## 2014-05-16 DIAGNOSIS — R531 Weakness: Secondary | ICD-10-CM

## 2014-05-16 DIAGNOSIS — R197 Diarrhea, unspecified: Secondary | ICD-10-CM

## 2014-05-16 DIAGNOSIS — E43 Unspecified severe protein-calorie malnutrition: Secondary | ICD-10-CM

## 2014-05-16 DIAGNOSIS — R5383 Other fatigue: Secondary | ICD-10-CM

## 2014-05-16 DIAGNOSIS — K5289 Other specified noninfective gastroenteritis and colitis: Secondary | ICD-10-CM

## 2014-05-16 DIAGNOSIS — R5381 Other malaise: Secondary | ICD-10-CM

## 2014-05-16 DIAGNOSIS — M1990 Unspecified osteoarthritis, unspecified site: Secondary | ICD-10-CM | POA: Insufficient documentation

## 2014-05-16 DIAGNOSIS — M159 Polyosteoarthritis, unspecified: Secondary | ICD-10-CM

## 2014-05-16 DIAGNOSIS — G4700 Insomnia, unspecified: Secondary | ICD-10-CM

## 2014-05-16 DIAGNOSIS — K529 Noninfective gastroenteritis and colitis, unspecified: Secondary | ICD-10-CM

## 2014-05-16 DIAGNOSIS — M150 Primary generalized (osteo)arthritis: Secondary | ICD-10-CM

## 2014-05-16 LAB — STOOL CULTURE: Special Requests: NORMAL

## 2014-05-16 NOTE — Progress Notes (Signed)
Patient ID: Debra Dillon, female   DOB: 07-06-1941, 73 y.o.   MRN: [id number]               PROGRESS NOTE  DATE: 05/16/2014  FACILITY: Nursing Home [facility]  LEVEL OF CARE: SNF (31)  Acute Visit  CHIEF COMPLAINT:  Follow-up Hospitalization  HISTORY OF PRESENT ILLNESS: This is a 73 year old female who has been [facility] with Colitis, Diarrhea and generalized weakness. She has been admitted for a short-term rehabilitation.  REASSESSMENT OF ONGOING PROBLEM(S):  ARTHRITIS: Patient's arthritis remains stable.  The patient denies ongoing joint pains, stiffness, swelling, warmth & redness.  No complications reported from the medication(s) currently being used.  INSOMNIA: The insomnia remains stable.  No complications noted from the medications presently being used. Patient denies ongoing insomnia, pain, hallucinations, delusions.  PAST MEDICAL HISTORY : Reviewed.  No changes/see problem list  CURRENT MEDICATIONS: Reviewed per MAR/see medication list  REVIEW OF SYSTEMS:  GENERAL: no change in appetite, no fatigue,  no fever, chills  RESPIRATORY: no cough, SOB, DOE, wheezing, hemoptysis CARDIAC: no chest pain, edema or palpitations GI: no abdominal pain,  constipation, heart burn   PHYSICAL EXAMINATION  GENERAL: no acute distress, thin body habitus EYES: conjunctivae normal, sclerae normal, normal eye lids NECK: supple, trachea midline, no neck masses, no thyroid tenderness, no thyromegaly LYMPHATICS: no LAN in the neck, no supraclavicular LAN RESPIRATORY: breathing is even & unlabored, BS CTAB CARDIAC: RRR, no murmur,no extra heart sounds, no edema GI: abdomen soft, normal BS, no masses, no tenderness, no hepatomegaly, no splenomegaly EXTREMITIES:  Able to move all 4 extremities PSYCHIATRIC: the patient is alert & oriented to person, affect & behavior appropriate  LABS/RADIOLOGY: Labs  reviewed: Basic Metabolic Panel:  Recent Labs  65/78/4606/28/15 0524 05/14/14 0525 05/15/14 0520  NA 135* 139 138  K 3.3* 3.7 3.8  CL 101 108 106  CO2 23 21 23   GLUCOSE 104* 100* 103*  BUN 14 4* <3*  CREATININE 0.84 0.75 0.69  CALCIUM 7.2* 7.1* 7.2*   Liver Function Tests:  Recent Labs  05/12/14 1332  AST 10  ALT 9  ALKPHOS 108  BILITOT 0.2*  PROT 7.1  ALBUMIN 2.9*    Recent Labs  05/12/14 1332  LIPASE 82*   CBC:  Recent Labs  05/12/14 1332 05/13/14 0524 05/14/14 0525 05/15/14 0520  WBC 12.7* 9.3 6.3 7.4  NEUTROABS 10.1*  --   --   --   HGB 12.5 10.2* 9.5* 9.5*  HCT 39.6 32.5* 30.6* 30.7*  MCV 70.7* 71.1* 72.5* 72.7*  PLT 394 310 279 308   CLINICAL DATA:  Intermittent vomiting and diarrhea for 30 days, loss 19 lb lost month   EXAM: CT ABDOMEN AND PELVIS WITHOUT CONTRAST   TECHNIQUE: Multidetector CT imaging of the abdomen and pelvis was performed following the standard protocol without IV contrast.   COMPARISON:  None.   FINDINGS: Sagittal images of the spine shows degenerative changes thoracolumbar spine. There is about 5 mm chronic anterolisthesis L4 on L5 vertebral body. Significant disc space flattening with vacuum disc phenomenon and endplate sclerotic changes at L4-L5 and L5-S1 level.   Lung bases shows mild emphysematous changes   Atherosclerotic calcifications of abdominal aorta and iliac arteries. No aortic aneurysm. Unenhanced liver shows no biliary ductal dilatation. No calcified gallstones are noted within gallbladder. Unenhanced pancreas, spleen and adrenal glands are unremarkable. No nephrolithiasis. No hydronephrosis or hydroureter.  No calcified ureteral calculi are noted.   There is no small bowel obstruction. Contrast material is noted of the way in the colon. There is mild gaseous distension of the hepatic flexure of the colon and transverse colon. There is no colonic obstruction. Mild thickening of the right colonic wall  in the region of hepatic flexure and mild stranding of surrounding fat. Best seen in axial image 41. There is also thickening of the wall in and descending colon and sigmoid colon. Findings are highly suspicious for diffuse mild colitis. No pericolonic abscess is noted. The uterus is surgically absent. Urinary bladder is empty limiting its assessment. There is a subcutaneous nodule just inferior to left pubic symphysis axial images 76 measures 1.2 cm. Clinical correlation is necessary. No inguinal adenopathy. No destructive bony lesions are noted within pelvis.   IMPRESSION: 1. There is no evidence of small bowel or colonic obstruction. 2. No hydronephrosis or hydroureter.  No nephrolithiasis. 3. Nonspecific mild thickening of colonic wall in right colon splenic flexure of the colon descending colon and sigmoid colon. Mild gaseous distension of transverse colon. Findings are suspicious for diffuse colitis. Clinical correlation is necessary. 4. Atherosclerotic calcifications of abdominal aorta and iliac arteries. 5. Degenerative changes lumbar spine. 6. Surgically absent uterus.   ASSESSMENT/PLAN:  Colitis - continue Cipro and Flagyl X 7 more days Generalized weakness - for rehabilitation Diarrhea - resolving, (-) for C-diff Severe protein-calorie malnutrition - continue supplementation Osteoarthritis - continue Anaprox Insomnia - wants to take Trazodone @ HS routinely    CPT CODE: [id number]  Ella BodoMonina Vargas - NP [facility] [phone]

## 2014-05-22 ENCOUNTER — Non-Acute Institutional Stay (SKILLED_NURSING_FACILITY): Payer: Medicare Other | Admitting: Internal Medicine

## 2014-05-22 DIAGNOSIS — D649 Anemia, unspecified: Secondary | ICD-10-CM

## 2014-05-22 DIAGNOSIS — M810 Age-related osteoporosis without current pathological fracture: Secondary | ICD-10-CM

## 2014-05-22 DIAGNOSIS — K5289 Other specified noninfective gastroenteritis and colitis: Secondary | ICD-10-CM

## 2014-05-22 DIAGNOSIS — J309 Allergic rhinitis, unspecified: Secondary | ICD-10-CM

## 2014-05-22 DIAGNOSIS — K529 Noninfective gastroenteritis and colitis, unspecified: Secondary | ICD-10-CM

## 2014-05-25 ENCOUNTER — Non-Acute Institutional Stay (SKILLED_NURSING_FACILITY): Payer: Medicare Other | Admitting: Adult Health

## 2014-05-25 ENCOUNTER — Encounter: Payer: Self-pay | Admitting: Adult Health

## 2014-05-25 DIAGNOSIS — M150 Primary generalized (osteo)arthritis: Secondary | ICD-10-CM

## 2014-05-25 DIAGNOSIS — R197 Diarrhea, unspecified: Secondary | ICD-10-CM

## 2014-05-25 DIAGNOSIS — K529 Noninfective gastroenteritis and colitis, unspecified: Secondary | ICD-10-CM

## 2014-05-25 DIAGNOSIS — M159 Polyosteoarthritis, unspecified: Secondary | ICD-10-CM

## 2014-05-25 DIAGNOSIS — K5289 Other specified noninfective gastroenteritis and colitis: Secondary | ICD-10-CM

## 2014-05-25 DIAGNOSIS — E43 Unspecified severe protein-calorie malnutrition: Secondary | ICD-10-CM

## 2014-05-25 DIAGNOSIS — G4700 Insomnia, unspecified: Secondary | ICD-10-CM

## 2014-05-25 NOTE — Progress Notes (Signed)
Patient ID: Debra Dillon, female   DOB: [birth year], 73 y.o.   MRN: [id number]              PROGRESS NOTE  DATE:  05/25/14  FACILITY: Nursing Home [facility]  LEVEL OF CARE: SNF (31)  Acute Visit  CHIEF COMPLAINT:  Discharge Notes  HISTORY OF PRESENT ILLNESS: This is a 73 year old female who is for discharge home with daughter in [place]. She has been [facility] with Colitis, Diarrhea and generalized weakness. Patient was admitted to this facility for short-term rehabilitation after the patient's recent hospitalization.  Patient has completed SNF rehabilitation and therapy has cleared the patient for discharge.  REASSESSMENT OF ONGOING PROBLEM(S):  ARTHRITIS: Patient's arthritis remains stable.  The patient denies ongoing joint pains, stiffness, swelling, warmth & redness.  No complications reported from the medication(s) currently being used.  INSOMNIA: The insomnia remains stable.  No complications noted from the medications presently being used. Patient denies ongoing insomnia, pain, hallucinations, delusions.  PAST MEDICAL HISTORY : Reviewed.  No changes/see problem list  CURRENT MEDICATIONS: Reviewed per MAR/see medication list  REVIEW OF SYSTEMS:  GENERAL: no change in appetite, no fatigue,  no fever, chills  RESPIRATORY: no cough, SOB, DOE, wheezing, hemoptysis CARDIAC: no chest pain, edema or palpitations GI: no abdominal pain,  constipation, heart burn   PHYSICAL EXAMINATION  GENERAL: no acute distress, thin body habitus NECK: supple, trachea midline, no neck masses, no thyroid tenderness, no thyromegaly LYMPHATICS: no LAN in the neck, no supraclavicular LAN RESPIRATORY: breathing is even & unlabored, BS CTAB CARDIAC: RRR, no murmur,no extra heart sounds, no edema GI: abdomen soft, normal BS, no masses, no tenderness, no hepatomegaly, no splenomegaly EXTREMITIES:  Able to move all 4  extremities PSYCHIATRIC: the patient is alert & oriented to person, affect & behavior appropriate  LABS/RADIOLOGY: Labs reviewed: Basic Metabolic Panel:  Recent Labs  65/78/4606/28/15 0524 05/14/14 0525 05/15/14 0520  NA 135* 139 138  K 3.3* 3.7 3.8  CL 101 108 106  CO2 23 21 23   GLUCOSE 104* 100* 103*  BUN 14 4* <3*  CREATININE 0.84 0.75 0.69  CALCIUM 7.2* 7.1* 7.2*   Liver Function Tests:  Recent Labs  05/12/14 1332  AST 10  ALT 9  ALKPHOS 108  BILITOT 0.2*  PROT 7.1  ALBUMIN 2.9*    Recent Labs  05/12/14 1332  LIPASE 82*   CBC:  Recent Labs  05/12/14 1332 05/13/14 0524 05/14/14 0525 05/15/14 0520  WBC 12.7* 9.3 6.3 7.4  NEUTROABS 10.1*  --   --   --   HGB 12.5 10.2* 9.5* 9.5*  HCT 39.6 32.5* 30.6* 30.7*  MCV 70.7* 71.1* 72.5* 72.7*  PLT 394 310 279 308   CLINICAL DATA:  Intermittent vomiting and diarrhea for 30 days, loss 19 lb lost month   EXAM: CT ABDOMEN AND PELVIS WITHOUT CONTRAST   TECHNIQUE: Multidetector CT imaging of the abdomen and pelvis was performed following the standard protocol without IV contrast.   COMPARISON:  None.   FINDINGS: Sagittal images of the spine shows degenerative changes thoracolumbar spine. There is about 5 mm chronic anterolisthesis L4 on L5 vertebral body. Significant disc space flattening with vacuum disc phenomenon and endplate sclerotic changes at L4-L5 and L5-S1 level.   Lung bases shows mild emphysematous changes   Atherosclerotic calcifications of abdominal aorta and iliac arteries. No aortic aneurysm. Unenhanced liver shows no biliary ductal dilatation.  No calcified gallstones are noted within gallbladder. Unenhanced pancreas, spleen and adrenal glands are unremarkable. No nephrolithiasis. No hydronephrosis or hydroureter. No calcified ureteral calculi are noted.   There is no small bowel obstruction. Contrast material is noted of the way in the colon. There is mild gaseous distension of the hepatic  flexure of the colon and transverse colon. There is no colonic obstruction. Mild thickening of the right colonic wall in the region of hepatic flexure and mild stranding of surrounding fat. Best seen in axial image 41. There is also thickening of the wall in and descending colon and sigmoid colon. Findings are highly suspicious for diffuse mild colitis. No pericolonic abscess is noted. The uterus is surgically absent. Urinary bladder is empty limiting its assessment. There is a subcutaneous nodule just inferior to left pubic symphysis axial images 76 measures 1.2 cm. Clinical correlation is necessary. No inguinal adenopathy. No destructive bony lesions are noted within pelvis.   IMPRESSION: 1. There is no evidence of small bowel or colonic obstruction. 2. No hydronephrosis or hydroureter.  No nephrolithiasis. 3. Nonspecific mild thickening of colonic wall in right colon splenic flexure of the colon descending colon and sigmoid colon. Mild gaseous distension of transverse colon. Findings are suspicious for diffuse colitis. Clinical correlation is necessary. 4. Atherosclerotic calcifications of abdominal aorta and iliac arteries. 5. Degenerative changes lumbar spine. 6. Surgically absent uterus.   ASSESSMENT/PLAN:  Colitis - Resolved Diarrhea - Resolved Severe protein-calorie malnutrition - continue supplementation Osteoarthritis - continue Anaprox Insomnia - stable; continue Trazodone  I have filled out patient's discharge paperwork and written prescriptions.  Daughter will establish primary care and will discuss outpatient rehabilitation.   Total discharge time: Less than 30 minutes  Discharge time involved coordination of the discharge process with Child psychotherapist, nursing staff and therapy department.    CPT CODE: 28413  Ella Bodo - NP [employer] [phone]

## 2014-05-26 DIAGNOSIS — D649 Anemia, unspecified: Secondary | ICD-10-CM | POA: Insufficient documentation

## 2014-05-26 DIAGNOSIS — M810 Age-related osteoporosis without current pathological fracture: Secondary | ICD-10-CM | POA: Insufficient documentation

## 2014-05-26 DIAGNOSIS — J309 Allergic rhinitis, unspecified: Secondary | ICD-10-CM | POA: Insufficient documentation

## 2014-05-26 NOTE — Progress Notes (Signed)
HISTORY & PHYSICAL  DATE: 05/22/2014   FACILITY: [facility]  LEVEL OF CARE: SNF (31)  ALLERGIES:  No Known Allergies  CHIEF COMPLAINT:  Manage diffuse colitis, osteoporosis and allergic rhinitis  HISTORY OF PRESENT ILLNESS: 73 year old Caucasian female was hospitalized secondary to diarrhea, abdominal pain and weight loss. After hospitalization patient is admitted to this facility for short-term rehabilitation.  COLITIS: CT of the abdomen and pelvis showed thickening of the colonic Wall and she was diagnosed with diffuse colitis. Stool for C. difficile was negative and no ova or parasites on stool culture. Patient is currently receiving Cipro and Flagyl and tolerated them without any problems. She denies further abdominal pain, or diarrhea.  ALLERGIC RHINITIS: Allergic rhinitis remains stable.  Patient denies ongoing symptoms such as runny nose sneezing or tearing. No complications reported from the current medication(s) being used.  OSTEOPOROSIS: The patient's osteoporosis remains stable. The patient denies recent worsening of pain and the range of motion remains stable. There has not been any evidence of fractures. No complications noted from the medications presently being used.  PAST MEDICAL HISTORY :  Past Medical History  Diagnosis Date  . GERD (gastroesophageal reflux disease)   . Insomnia   . Acute bronchitis   . Neck pain   . Back pain   . Allergic rhinitis     PAST SURGICAL HISTORY: Past Surgical History  Procedure Laterality Date  . Tubal ligation    . Vesicovaginal fistula closure w/ tah    . Abdominal hysterectomy      SOCIAL HISTORY:  reports that she has been smoking Cigarettes.  She has a 38 pack-year smoking history. She has never used smokeless tobacco. She reports that she does not drink alcohol or use illicit drugs.  FAMILY HISTORY:  Family History  Problem Relation Age of Onset  . Emphysema Sister   . COPD Sister     . Allergies Daughter   . Clotting disorder Brother   . Arthritis Sister   . Heart disease Father   . Heart disease Brother   . Heart disease Sister   . Cancer Maternal Aunt   . Crohn's disease Neg Hx   . Ulcerative colitis Neg Hx   . Colon cancer Neg Hx   . Alzheimer's disease Mother     CURRENT MEDICATIONS: Reviewed per MAR/see medication list  REVIEW OF SYSTEMS:  See HPI otherwise 14 point ROS is negative.  PHYSICAL EXAMINATION  VS:  See VS section  GENERAL: no acute distress, normal body habitus EYES: conjunctivae normal, sclerae normal, normal eye lids MOUTH/THROAT: lips without lesions,no lesions in the mouth,tongue is without lesions,uvula elevates in midline NECK: supple, trachea midline, no neck masses, no thyroid tenderness, no thyromegaly LYMPHATICS: no LAN in the neck, no supraclavicular LAN RESPIRATORY: breathing is even & unlabored, BS CTAB CARDIAC: RRR, no murmur,no extra heart sounds, no edema GI:  ABDOMEN: abdomen soft, normal BS, no masses, no tenderness  LIVER/SPLEEN: no hepatomegaly, no splenomegaly MUSCULOSKELETAL: HEAD: normal to inspection  EXTREMITIES: LEFT UPPER EXTREMITY: full range of motion, normal strength & tone RIGHT UPPER EXTREMITY:  full range of motion, normal strength & tone LEFT LOWER EXTREMITY:  full range of motion, normal strength & tone RIGHT LOWER EXTREMITY:  full range of motion, normal strength & tone PSYCHIATRIC: the patient is alert & oriented to person, affect & behavior appropriate  LABS/RADIOLOGY:  Labs reviewed: Basic Metabolic Panel:  Recent Labs  16/10/96 0524 05/14/14 0525  05/15/14 0520  NA 135* 139 138  K 3.3* 3.7 3.8  CL 101 108 106  CO2 23 21 23   GLUCOSE 104* 100* 103*  BUN 14 4* <3*  CREATININE 0.84 0.75 0.69  CALCIUM 7.2* 7.1* 7.2*   Liver Function Tests:  Recent Labs  05/12/14 1332  AST 10  ALT 9  ALKPHOS 108  BILITOT 0.2*  PROT 7.1  ALBUMIN 2.9*    Recent Labs  05/12/14 1332  LIPASE  82*   CBC:  Recent Labs  05/12/14 1332 05/13/14 0524 05/14/14 0525 05/15/14 0520  WBC 12.7* 9.3 6.3 7.4  NEUTROABS 10.1*  --   --   --   HGB 12.5 10.2* 9.5* 9.5*  HCT 39.6 32.5* 30.6* 30.7*  MCV 70.7* 71.1* 72.5* 72.7*  PLT 394 310 279 308    EXAM: CT ABDOMEN AND PELVIS WITHOUT CONTRAST   TECHNIQUE: Multidetector CT imaging of the abdomen and pelvis was performed following the standard protocol without IV contrast.   COMPARISON:  None.   FINDINGS: Sagittal images of the spine shows degenerative changes thoracolumbar spine. There is about 5 mm chronic anterolisthesis L4 on L5 vertebral body. Significant disc space flattening with vacuum disc phenomenon and endplate sclerotic changes at L4-L5 and L5-S1 level.   Lung bases shows mild emphysematous changes   Atherosclerotic calcifications of abdominal aorta and iliac arteries. No aortic aneurysm. Unenhanced liver shows no biliary ductal dilatation. No calcified gallstones are noted within gallbladder. Unenhanced pancreas, spleen and adrenal glands are unremarkable. No nephrolithiasis. No hydronephrosis or hydroureter. No calcified ureteral calculi are noted.   There is no small bowel obstruction. Contrast material is noted of the way in the colon. There is mild gaseous distension of the hepatic flexure of the colon and transverse colon. There is no colonic obstruction. Mild thickening of the right colonic wall in the region of hepatic flexure and mild stranding of surrounding fat. Best seen in axial image 41. There is also thickening of the wall in and descending colon and sigmoid colon. Findings are highly suspicious for diffuse mild colitis. No pericolonic abscess is noted. The uterus is surgically absent. Urinary bladder is empty limiting its assessment. There is a subcutaneous nodule just inferior to left pubic symphysis axial images 76 measures 1.2 cm. Clinical correlation is necessary. No inguinal adenopathy.  No destructive bony lesions are noted within pelvis.   IMPRESSION: 1. There is no evidence of small bowel or colonic obstruction. 2. No hydronephrosis or hydroureter.  No nephrolithiasis. 3. Nonspecific mild thickening of colonic wall in right colon splenic flexure of the colon descending colon and sigmoid colon. Mild gaseous distension of transverse colon. Findings are suspicious for diffuse colitis. Clinical correlation is necessary. 4. Atherosclerotic calcifications of abdominal aorta and iliac arteries. 5. Degenerative changes lumbar spine. 6. Surgically absent uterus.    ASSESSMENT/PLAN:  Diffuse colitis-continue Cipro and Flagyl as prescribed Osteoporosis-continue Fosamax Allergic rhinitis-well controlled Microcytic anemia-check iron studies and recheck hemoglobin GERD-continue Prilosec Severe protein calorie malnutrition-continue feeding supplements Check CBC  I have reviewed patient's medical records received at admission/from hospitalization.  CPT CODE: [id number]  Angela CoxGayani Y Layne Dilauro, MD [facility] [phone]

## 2014-06-12 ENCOUNTER — Ambulatory Visit: Payer: Medicare Other | Admitting: Licensed Clinical Social Worker

## 2014-06-13 ENCOUNTER — Ambulatory Visit (INDEPENDENT_AMBULATORY_CARE_PROVIDER_SITE_OTHER): Payer: No Typology Code available for payment source | Admitting: Licensed Clinical Social Worker

## 2014-06-13 DIAGNOSIS — F331 Major depressive disorder, recurrent, moderate: Secondary | ICD-10-CM

## 2014-06-27 ENCOUNTER — Ambulatory Visit: Payer: No Typology Code available for payment source | Admitting: Licensed Clinical Social Worker

## 2014-07-04 ENCOUNTER — Ambulatory Visit (INDEPENDENT_AMBULATORY_CARE_PROVIDER_SITE_OTHER): Payer: No Typology Code available for payment source | Admitting: Licensed Clinical Social Worker

## 2014-07-04 DIAGNOSIS — F331 Major depressive disorder, recurrent, moderate: Secondary | ICD-10-CM

## 2014-08-01 ENCOUNTER — Ambulatory Visit (INDEPENDENT_AMBULATORY_CARE_PROVIDER_SITE_OTHER): Payer: No Typology Code available for payment source | Admitting: Licensed Clinical Social Worker

## 2014-08-01 DIAGNOSIS — F331 Major depressive disorder, recurrent, moderate: Secondary | ICD-10-CM

## 2014-09-05 ENCOUNTER — Ambulatory Visit: Payer: No Typology Code available for payment source | Admitting: Licensed Clinical Social Worker

## 2014-10-16 DEATH — deceased

## 2014-11-22 IMAGING — CT CT HEAD W/O CM
2 series · 16 of 30 positions shown, 19 images · non-contrast
Comparison: None.

CLINICAL DATA: Altered mental status.  Fall.  Laceration to
posterior head.

CT HEAD WITHOUT CONTRAST
TECHNIQUE: Contiguous axial images were obtained from the base of
the skull through the vertex without contrast.

[Series 2: head w/o · axial · non-contrast · 0.43mm/px · z∈[-337,-222]mm · 9 of 29 slices shown, 12 images]
[im 3/29  brain]
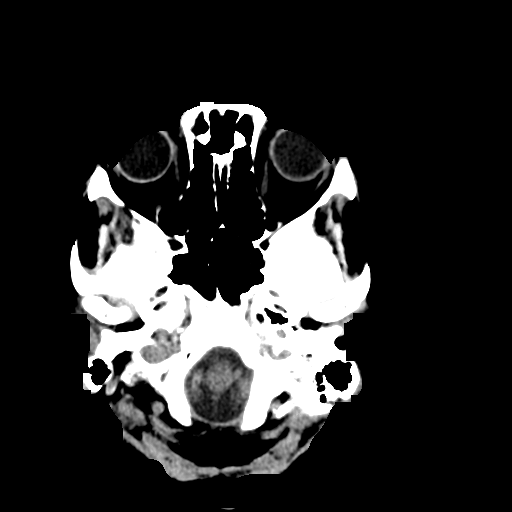
[im 3/29  bone]
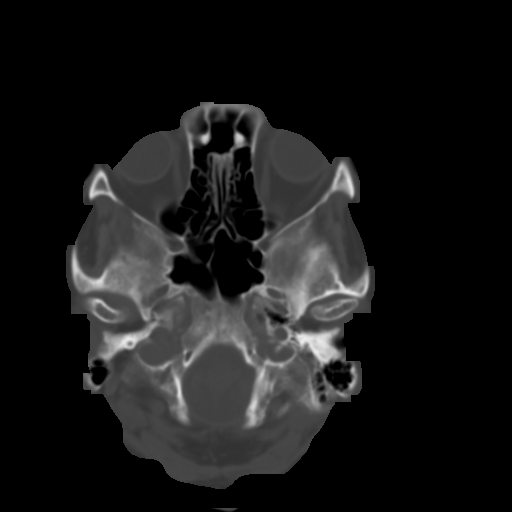
[im 6/29  brain]
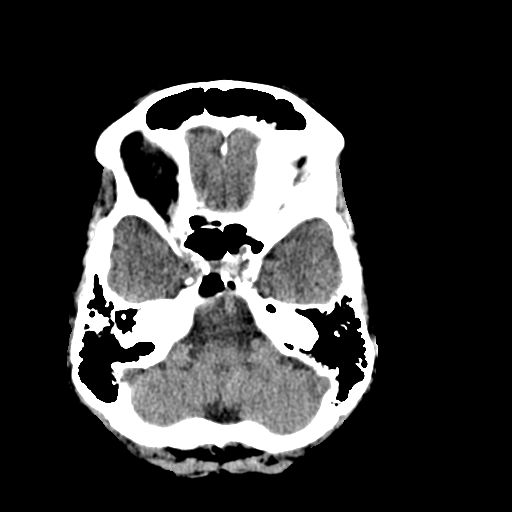
[im 9/29  brain]
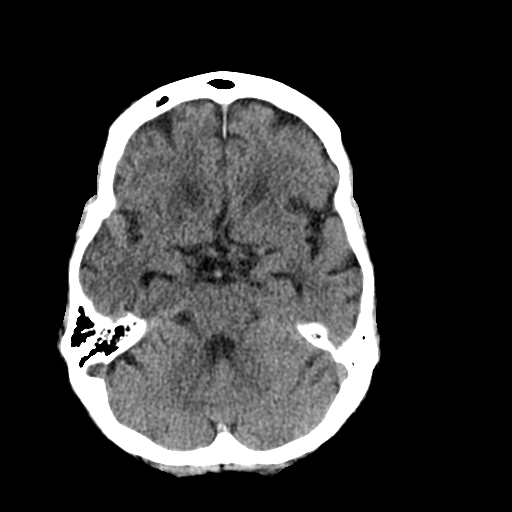
[im 12/29  brain]
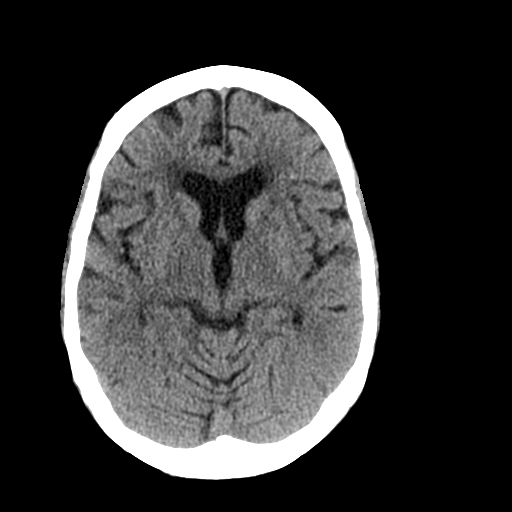
[im 15/29  brain]
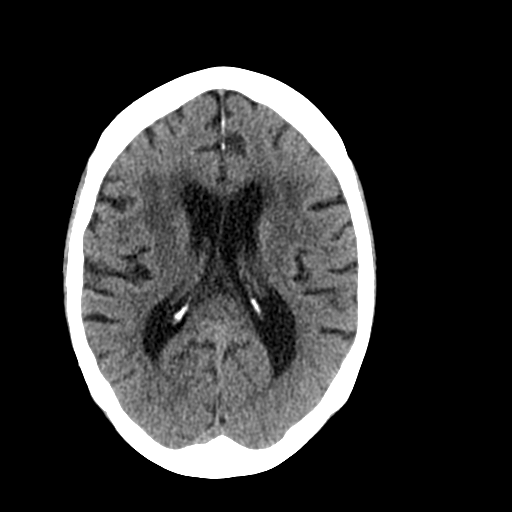
[im 15/29  bone]
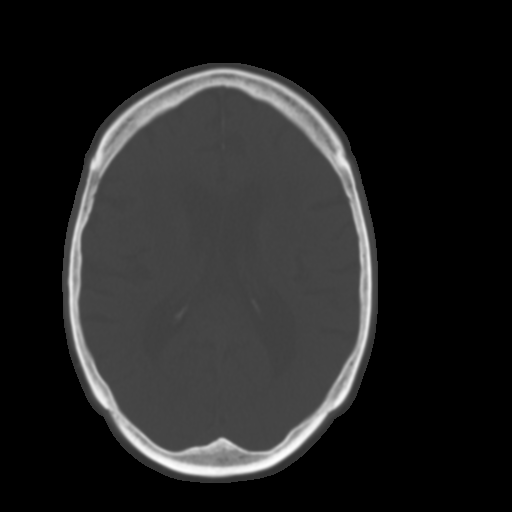
[im 17/29  brain]
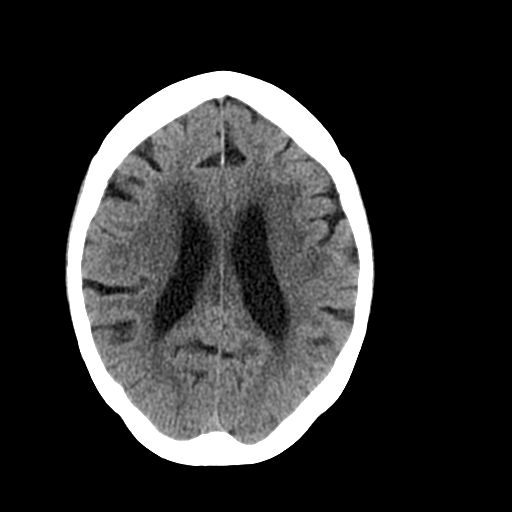
[im 20/29  brain]
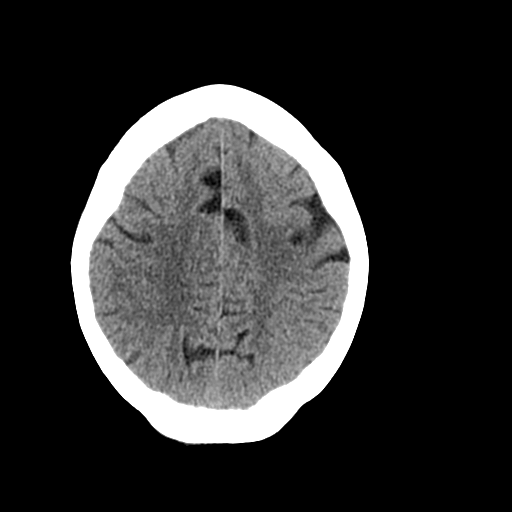
[im 23/29  brain]
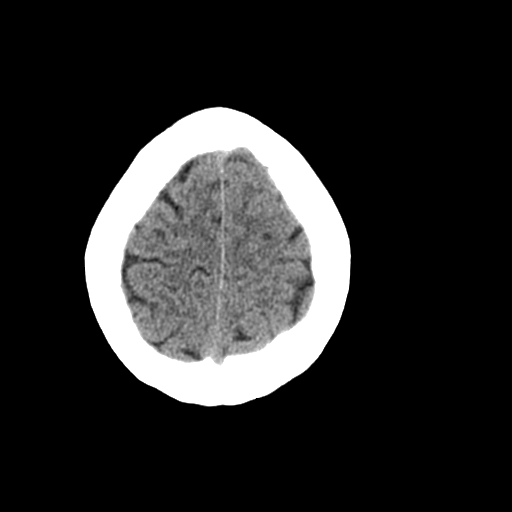
[im 26/29  brain]
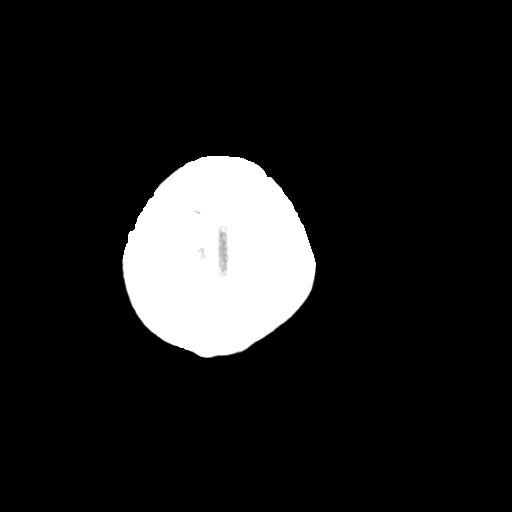
[im 26/29  bone]
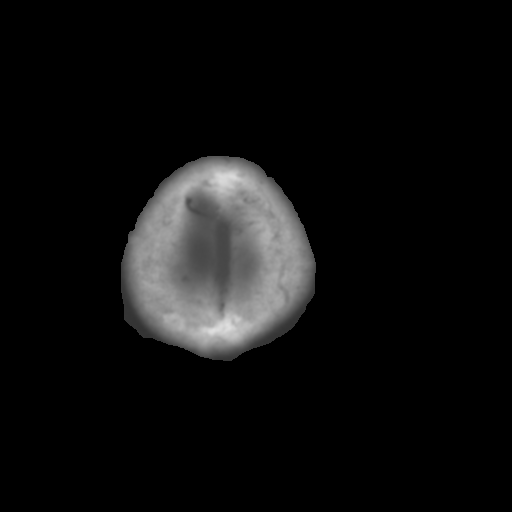

[Series 3: bone windows · axial · 0.43mm/px · z∈[-332,-239]mm · 7 of 48 slices shown]
[im 6/48  bone]
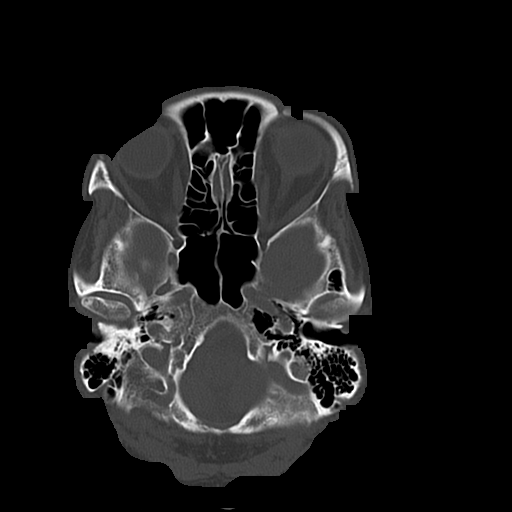
[im 11/48  bone]
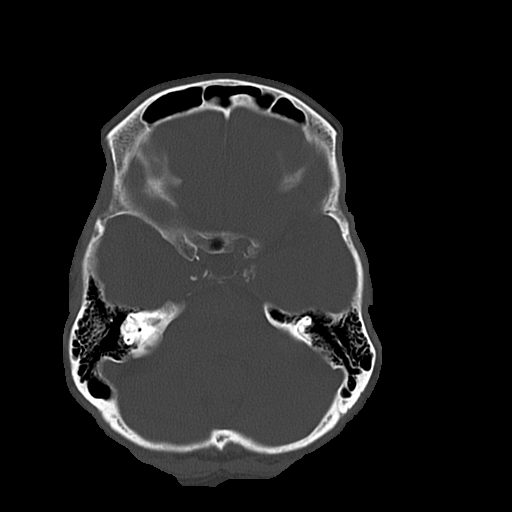
[im 16/48  bone]
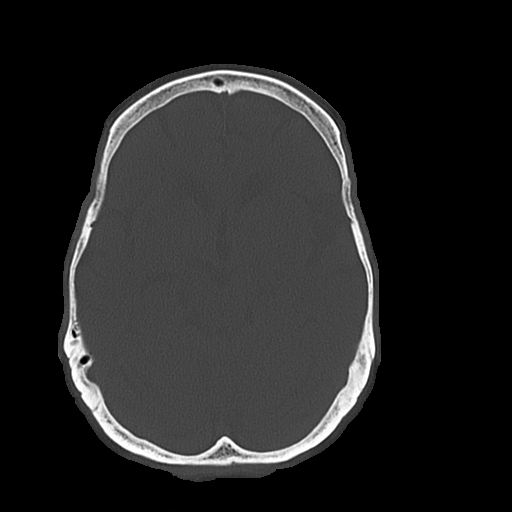
[im 21/48  bone]
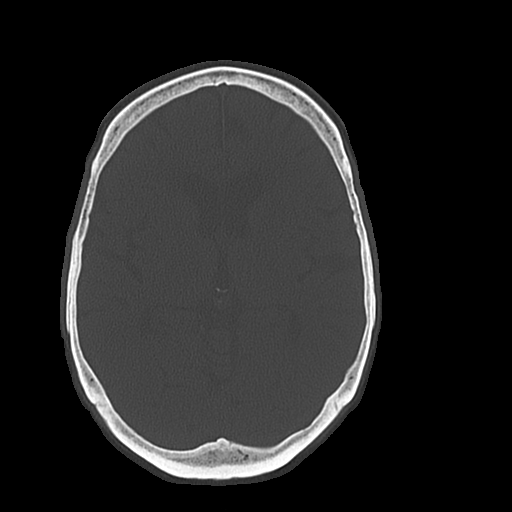
[im 27/48  bone]
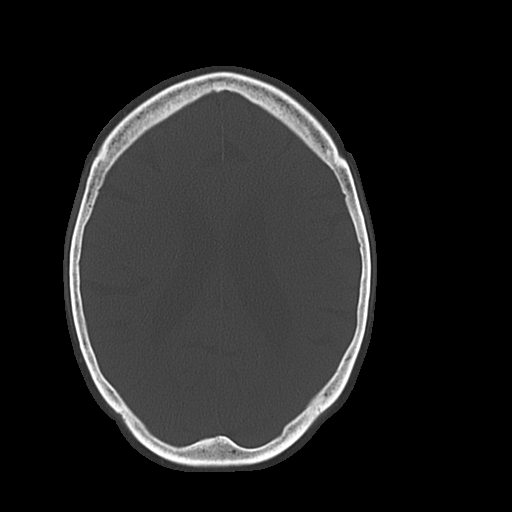
[im 32/48  bone]
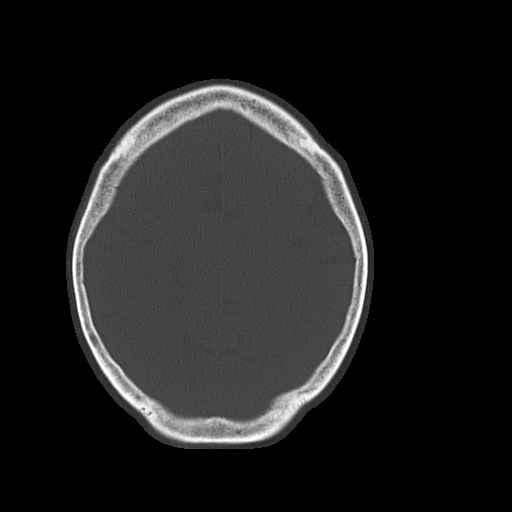
[im 37/48  bone]
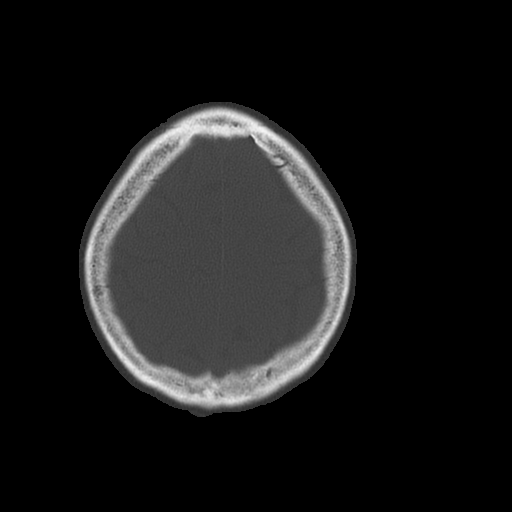

[16 of 30 positions shown; findings below may reference images not displayed]

FINDINGS: Atrophy and moderate white matter disease are present.
This is somewhat advanced for age.

No acute cortical infarct, hemorrhage, or mass lesion is present.
The ventricles are proportionate to the degree of atrophy.  No
significant extra-axial fluid collection is present.  Remote
lacunar infarcts are present in the cerebellum.

The paranasal sinuses and mastoid air cells are clear.  The osseous
skull is intact.  Atherosclerotic calcifications are present in the
cavernous carotid arteries bilaterally.
IMPRESSION: 1.  Atrophy and white matter disease is somewhat advanced for age.
This likely reflects the sequelae of chronic microvascular
ischemia.
2.  No acute intracranial abnormality.
3.  Atherosclerosis.
